# Patient Record
Sex: Male | Born: 1950 | Race: White | Hispanic: No | Marital: Married | Smoking: Former smoker
Health system: Southern US, Community
[De-identification: ages and names within clinical notes are randomized; demographics above are authoritative.]

## PROBLEM LIST (undated history)

## (undated) DIAGNOSIS — M8580 Other specified disorders of bone density and structure, unspecified site: Secondary | ICD-10-CM

## (undated) DIAGNOSIS — Z860101 Personal history of adenomatous and serrated colon polyps: Secondary | ICD-10-CM

## (undated) DIAGNOSIS — Z808 Family history of malignant neoplasm of other organs or systems: Secondary | ICD-10-CM

## (undated) DIAGNOSIS — E559 Vitamin D deficiency, unspecified: Secondary | ICD-10-CM

## (undated) DIAGNOSIS — K279 Peptic ulcer, site unspecified, unspecified as acute or chronic, without hemorrhage or perforation: Secondary | ICD-10-CM

## (undated) DIAGNOSIS — Z803 Family history of malignant neoplasm of breast: Secondary | ICD-10-CM

## (undated) DIAGNOSIS — Z807 Family history of other malignant neoplasms of lymphoid, hematopoietic and related tissues: Secondary | ICD-10-CM

## (undated) DIAGNOSIS — N200 Calculus of kidney: Secondary | ICD-10-CM

## (undated) DIAGNOSIS — Z8481 Family history of carrier of genetic disease: Principal | ICD-10-CM

## (undated) DIAGNOSIS — Z86010 Personal history of colonic polyps: Secondary | ICD-10-CM

## (undated) DIAGNOSIS — I10 Essential (primary) hypertension: Secondary | ICD-10-CM

## (undated) DIAGNOSIS — R001 Bradycardia, unspecified: Secondary | ICD-10-CM

## (undated) DIAGNOSIS — G4730 Sleep apnea, unspecified: Secondary | ICD-10-CM

## (undated) HISTORY — PX: EXTRACORPOREAL SHOCK WAVE LITHOTRIPSY: SHX1557

## (undated) HISTORY — DX: Vitamin D deficiency, unspecified: E55.9

## (undated) HISTORY — DX: Family history of malignant neoplasm of other organs or systems: Z80.8

## (undated) HISTORY — DX: Essential (primary) hypertension: I10

## (undated) HISTORY — PX: COLONOSCOPY: SHX174

## (undated) HISTORY — DX: Family history of carrier of genetic disease: Z84.81

## (undated) HISTORY — DX: Personal history of adenomatous and serrated colon polyps: Z86.0101

## (undated) HISTORY — DX: Peptic ulcer, site unspecified, unspecified as acute or chronic, without hemorrhage or perforation: K27.9

## (undated) HISTORY — DX: Other specified disorders of bone density and structure, unspecified site: M85.80

## (undated) HISTORY — DX: Personal history of colonic polyps: Z86.010

## (undated) HISTORY — DX: Family history of other malignant neoplasms of lymphoid, hematopoietic and related tissues: Z80.7

## (undated) HISTORY — DX: Family history of malignant neoplasm of breast: Z80.3

## (undated) HISTORY — DX: Calculus of kidney: N20.0

## (undated) HISTORY — DX: Sleep apnea, unspecified: G47.30

---

## 1970-05-29 HISTORY — PX: TONSILLECTOMY AND ADENOIDECTOMY: SUR1326

## 1979-05-30 HISTORY — PX: VASECTOMY: SHX75

## 1999-01-10 ENCOUNTER — Ambulatory Visit (HOSPITAL_COMMUNITY): Admission: RE | Admit: 1999-01-10 | Discharge: 1999-01-10 | Payer: Self-pay | Admitting: Gastroenterology

## 1999-01-10 ENCOUNTER — Encounter (INDEPENDENT_AMBULATORY_CARE_PROVIDER_SITE_OTHER): Payer: Self-pay

## 2001-10-23 ENCOUNTER — Ambulatory Visit (HOSPITAL_BASED_OUTPATIENT_CLINIC_OR_DEPARTMENT_OTHER): Admission: RE | Admit: 2001-10-23 | Discharge: 2001-10-23 | Payer: Self-pay | Admitting: Otolaryngology

## 2002-09-08 ENCOUNTER — Encounter: Payer: Self-pay | Admitting: Orthopedic Surgery

## 2002-09-08 ENCOUNTER — Ambulatory Visit (HOSPITAL_COMMUNITY): Admission: RE | Admit: 2002-09-08 | Discharge: 2002-09-08 | Payer: Self-pay | Admitting: Orthopedic Surgery

## 2004-03-09 ENCOUNTER — Ambulatory Visit (HOSPITAL_COMMUNITY): Admission: RE | Admit: 2004-03-09 | Discharge: 2004-03-09 | Payer: Self-pay | Admitting: Gastroenterology

## 2004-03-09 ENCOUNTER — Encounter (INDEPENDENT_AMBULATORY_CARE_PROVIDER_SITE_OTHER): Payer: Self-pay | Admitting: *Deleted

## 2008-03-01 ENCOUNTER — Emergency Department (HOSPITAL_COMMUNITY): Admission: EM | Admit: 2008-03-01 | Discharge: 2008-03-01 | Payer: Self-pay | Admitting: Family Medicine

## 2008-03-05 ENCOUNTER — Ambulatory Visit (HOSPITAL_COMMUNITY): Admission: RE | Admit: 2008-03-05 | Discharge: 2008-03-05 | Payer: Self-pay | Admitting: Urology

## 2009-08-26 IMAGING — CR DG ABDOMEN 1V
1 series · 1 of 1 positions shown · non-contrast
Comparison: CT urogram from [HOSPITAL] 03/02/2008.

CLINICAL DATA: Kidney stone.  Preoperative evaluation.

ABDOMEN - 1 VIEW

[t abdomen supine]
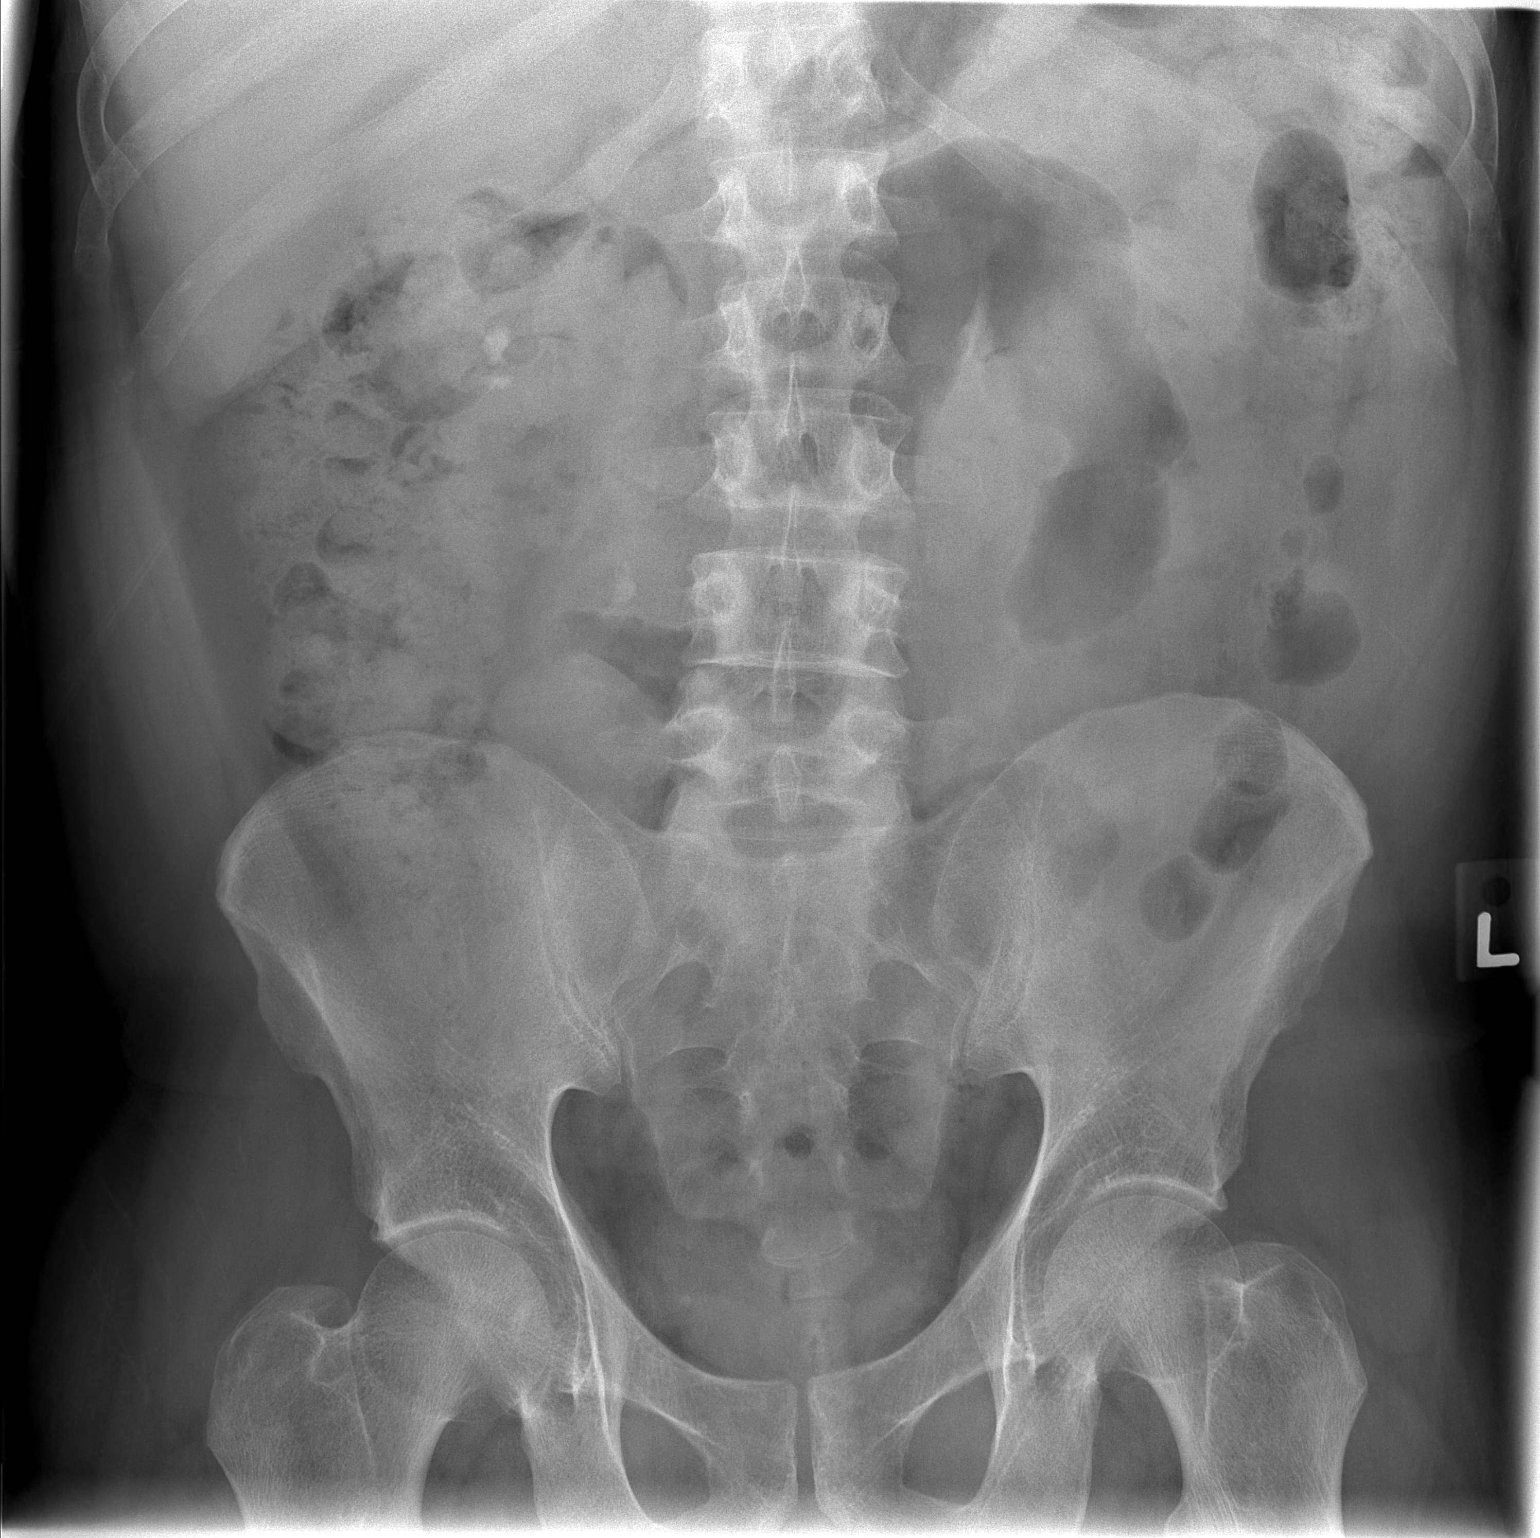

[1 of 1 positions shown; findings below may reference images not displayed]

FINDINGS: The approximately 9 mm calculus at the right ureteral
pelvic junction on the prior CT has passed distally and now
overlies the right L4 spinous process.  Right renal calculi are
redemonstrated, measuring up to 11 mm in diameter.  Small
calcifications in the right hemi pelvis are unchanged and
compatible with phleboliths.  No calcifications are seen over the
expected course of the left ureter.  The bowel gas pattern is
normal.
IMPRESSION: Slight distal passage of the right ureteral calculus, now located
at the L4 level.  Right renal calculi are unchanged.

## 2010-10-14 NOTE — Op Note (Signed)
Ryan [name], Ryan [name] NO.:  [id number]   MEDICAL RECORD NO.:  [id number]          PATIENT TYPE:  AMB   LOCATION:  ENDO                         FACILITY:  [facility]   PHYSICIAN:  Petra Kuba, M.D.    DATE OF BIRTH:  03/24/1951   DATE OF PROCEDURE:  03/09/2004  DATE OF DISCHARGE:                                 OPERATIVE REPORT   PROCEDURE PERFORMED:  Colonoscopy with polypectomy.   ENDOSCOPIST:  Petra Kuba, M.D.   INDICATIONS FOR PROCEDURE:  Patient with personal history of colon polyps,  family history of colon polyps.  Due for colonic screening.   Consent was signed after the risks, benefits, methods and options were  thoroughly discussed with the patient on multiple occasions.   MEDICINES USED:  Demerol 90 mg, Versed 9 mg.   DESCRIPTION OF PROCEDURE:  Rectal inspection was pertinent for external  hemorrhoids, small.  Digital exam was negative.  A video pediatric  adjustable colonoscope was inserted and easily advanced around the colon to  the cecum.  This did require rolling him on his back and some abdominal  pressure.  On insertion, rare left-sided diverticula were seen but no other  abnormalities.  The cecum was identified by the appendiceal orifice and the  ileocecal valve.  The scope was slowly withdrawn.  The prep was adequate.  There was some liquid stool that required washing and suctioning.  On slow  withdrawal through the colon, the cecum and the ascending were normal.  In  the mid transverse, a questionable tiny polyp was seen and was cold biopsied  times two.  There were two other tiny descending and distal sigmoid  questionable polyps which were also cold biopsied and put in the same  container.  On slow withdrawal through the colon, other than the rare left-  sided diverticula, a small pedunculated proximal sigmoid polyp was seen,  snared, electrocautery applied and the polyp was suctioned through the scope  and collected in the trap.  This  was put in a separate container.  Scope was  further withdrawn through the rectum, anorectal pullthrough and retroflexion  confirmed some small hemorrhoids.  Scope was straightened, readvanced a  short ways up the left side of the colon, air was suctioned, scope removed.  The patient tolerated the procedure well.  There was no immediate obvious  complication.   ENDOSCOPIC DIAGNOSIS:  1.  Internal and external hemorrhoids.  2.  Rare left-sided diverticula.  3.  Three questionable tiny polyps, cold biopsied in the transverse      descending and distal sigmoid.  4.  Small sigmoid polyp snared.  5.  Otherwise within normal limits to the cecum.   PLAN:  Await pathology to determine future colonic screening.  Happy to see  back p.r.n.  Otherwise return care to Dr. Kevan Ny for the customary health  care maintenance to include yearly rectals and guaiacs.       MEM/MEDQ  D:  03/09/2004  T:  03/09/2004  Job:  [id number]   cc:   Duncan Dull, M.D.  [address]  [place]  [place] [address]  Fax: [phone]

## 2011-02-28 LAB — POCT URINALYSIS DIP (DEVICE)
Ketones, ur: 15 — AB
Nitrite: NEGATIVE
Operator id: 235561
Protein, ur: 30 — AB
Urobilinogen, UA: 0.2
pH: 5

## 2012-05-29 HISTORY — PX: MOUTH SURGERY: SHX715

## 2012-07-23 ENCOUNTER — Other Ambulatory Visit: Payer: Self-pay

## 2013-07-09 DIAGNOSIS — K5730 Diverticulosis of large intestine without perforation or abscess without bleeding: Secondary | ICD-10-CM | POA: Diagnosis not present

## 2013-07-09 DIAGNOSIS — Z09 Encounter for follow-up examination after completed treatment for conditions other than malignant neoplasm: Secondary | ICD-10-CM | POA: Diagnosis not present

## 2013-07-09 DIAGNOSIS — Z86010 Personal history of colonic polyps: Secondary | ICD-10-CM | POA: Diagnosis not present

## 2013-08-06 DIAGNOSIS — Z0000 Encounter for general adult medical examination without abnormal findings: Secondary | ICD-10-CM | POA: Diagnosis not present

## 2013-08-06 DIAGNOSIS — Z23 Encounter for immunization: Secondary | ICD-10-CM | POA: Diagnosis not present

## 2013-08-06 DIAGNOSIS — E7800 Pure hypercholesterolemia, unspecified: Secondary | ICD-10-CM | POA: Diagnosis not present

## 2013-08-06 DIAGNOSIS — K219 Gastro-esophageal reflux disease without esophagitis: Secondary | ICD-10-CM | POA: Diagnosis not present

## 2013-08-06 DIAGNOSIS — I10 Essential (primary) hypertension: Secondary | ICD-10-CM | POA: Diagnosis not present

## 2013-08-06 DIAGNOSIS — Z136 Encounter for screening for cardiovascular disorders: Secondary | ICD-10-CM | POA: Diagnosis not present

## 2013-08-06 DIAGNOSIS — Z125 Encounter for screening for malignant neoplasm of prostate: Secondary | ICD-10-CM | POA: Diagnosis not present

## 2013-08-06 DIAGNOSIS — E559 Vitamin D deficiency, unspecified: Secondary | ICD-10-CM | POA: Diagnosis not present

## 2013-08-08 ENCOUNTER — Other Ambulatory Visit: Payer: Self-pay | Admitting: Family Medicine

## 2013-08-08 DIAGNOSIS — Z139 Encounter for screening, unspecified: Secondary | ICD-10-CM

## 2013-08-21 ENCOUNTER — Ambulatory Visit
Admission: RE | Admit: 2013-08-21 | Discharge: 2013-08-21 | Disposition: A | Discharge status: Home / Self Care | Payer: Medicare Other | Source: Ambulatory Visit | Attending: Family Medicine | Admitting: Family Medicine

## 2013-08-21 DIAGNOSIS — T148 Other injury of unspecified body region: Secondary | ICD-10-CM | POA: Diagnosis not present

## 2013-08-21 DIAGNOSIS — D2370 Other benign neoplasm of skin of unspecified lower limb, including hip: Secondary | ICD-10-CM | POA: Diagnosis not present

## 2013-08-21 DIAGNOSIS — D235 Other benign neoplasm of skin of trunk: Secondary | ICD-10-CM | POA: Diagnosis not present

## 2013-08-21 DIAGNOSIS — D1801 Hemangioma of skin and subcutaneous tissue: Secondary | ICD-10-CM | POA: Diagnosis not present

## 2013-08-21 DIAGNOSIS — Z139 Encounter for screening, unspecified: Secondary | ICD-10-CM

## 2013-08-21 DIAGNOSIS — L819 Disorder of pigmentation, unspecified: Secondary | ICD-10-CM | POA: Diagnosis not present

## 2013-08-21 DIAGNOSIS — D239 Other benign neoplasm of skin, unspecified: Secondary | ICD-10-CM | POA: Diagnosis not present

## 2013-08-21 DIAGNOSIS — L821 Other seborrheic keratosis: Secondary | ICD-10-CM | POA: Diagnosis not present

## 2013-08-21 DIAGNOSIS — Z136 Encounter for screening for cardiovascular disorders: Secondary | ICD-10-CM | POA: Diagnosis not present

## 2013-09-03 DIAGNOSIS — G4730 Sleep apnea, unspecified: Secondary | ICD-10-CM | POA: Diagnosis not present

## 2013-12-08 DIAGNOSIS — G4733 Obstructive sleep apnea (adult) (pediatric): Secondary | ICD-10-CM | POA: Diagnosis not present

## 2013-12-17 DIAGNOSIS — G245 Blepharospasm: Secondary | ICD-10-CM | POA: Diagnosis not present

## 2014-05-19 DIAGNOSIS — Z23 Encounter for immunization: Secondary | ICD-10-CM | POA: Diagnosis not present

## 2014-08-20 DIAGNOSIS — I10 Essential (primary) hypertension: Secondary | ICD-10-CM | POA: Diagnosis not present

## 2014-08-20 DIAGNOSIS — Z125 Encounter for screening for malignant neoplasm of prostate: Secondary | ICD-10-CM | POA: Diagnosis not present

## 2014-08-20 DIAGNOSIS — K219 Gastro-esophageal reflux disease without esophagitis: Secondary | ICD-10-CM | POA: Diagnosis not present

## 2014-08-20 DIAGNOSIS — E780 Pure hypercholesterolemia: Secondary | ICD-10-CM | POA: Diagnosis not present

## 2014-08-20 DIAGNOSIS — Z0000 Encounter for general adult medical examination without abnormal findings: Secondary | ICD-10-CM | POA: Diagnosis not present

## 2014-08-20 DIAGNOSIS — Z23 Encounter for immunization: Secondary | ICD-10-CM | POA: Diagnosis not present

## 2014-08-20 DIAGNOSIS — E559 Vitamin D deficiency, unspecified: Secondary | ICD-10-CM | POA: Diagnosis not present

## 2014-08-20 DIAGNOSIS — G4733 Obstructive sleep apnea (adult) (pediatric): Secondary | ICD-10-CM | POA: Diagnosis not present

## 2014-10-15 DIAGNOSIS — D1801 Hemangioma of skin and subcutaneous tissue: Secondary | ICD-10-CM | POA: Diagnosis not present

## 2014-10-15 DIAGNOSIS — D2371 Other benign neoplasm of skin of right lower limb, including hip: Secondary | ICD-10-CM | POA: Diagnosis not present

## 2014-10-15 DIAGNOSIS — L821 Other seborrheic keratosis: Secondary | ICD-10-CM | POA: Diagnosis not present

## 2014-12-22 DIAGNOSIS — B078 Other viral warts: Secondary | ICD-10-CM | POA: Diagnosis not present

## 2015-02-11 IMAGING — US US AORTA SCREENING (MEDICARE)
1 series · 9 of 9 positions shown · non-contrast
Comparison: None.

CLINICAL DATA: Hypertension, aneurysm screening

EXAM:
ULTRASOUND OF ABDOMINAL AORTA
TECHNIQUE: Ultrasound examination of the abdominal aorta was performed to
evaluate for abdominal aortic aneurysm.

[Series 1: us aorta screening (medicare) · 0.30mm/px · 9 of 9 slices shown]
[im 1/9]
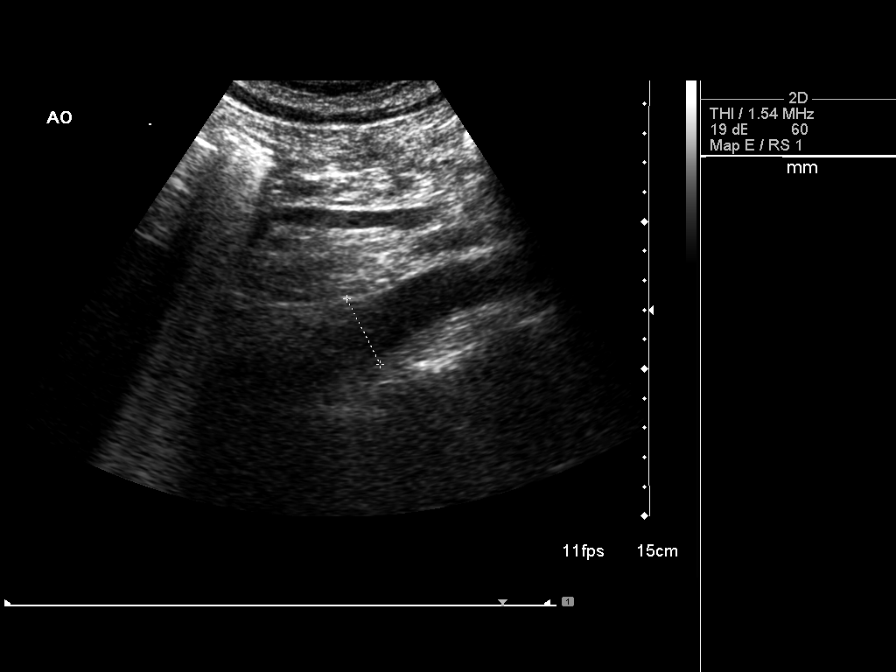
[im 2/9]
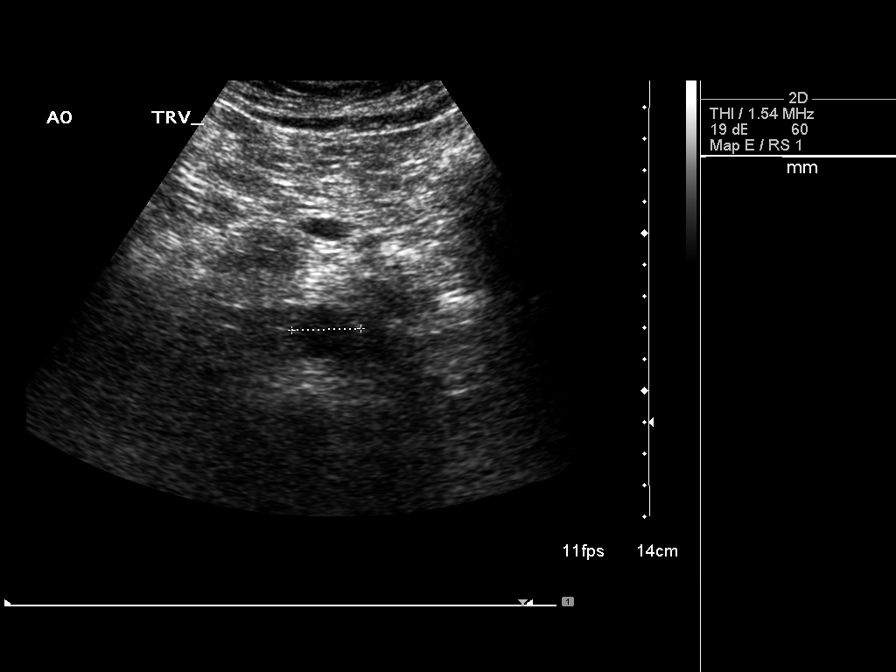
[im 3/9]
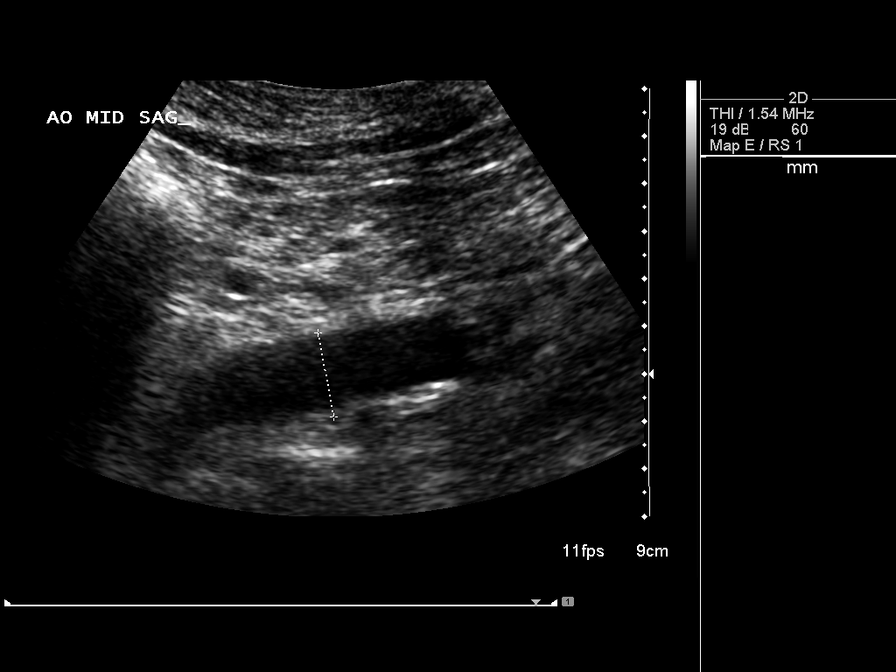
[im 4/9]
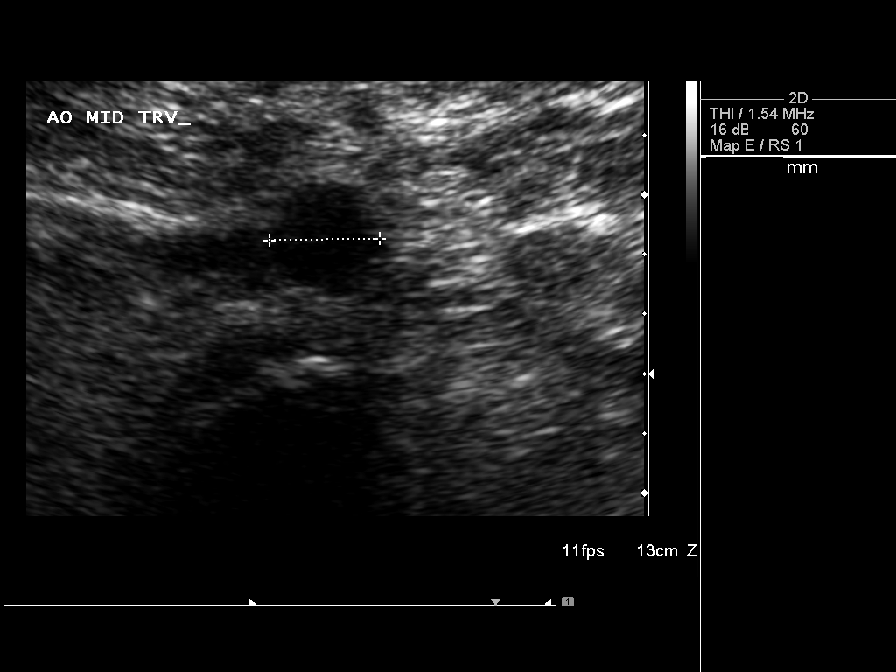
[im 5/9]
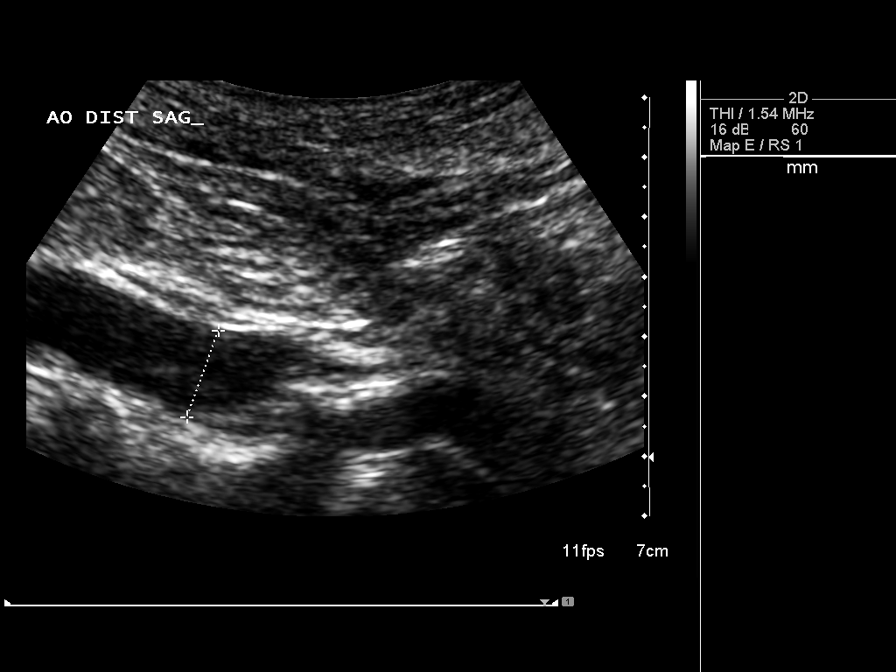
[im 6/9]
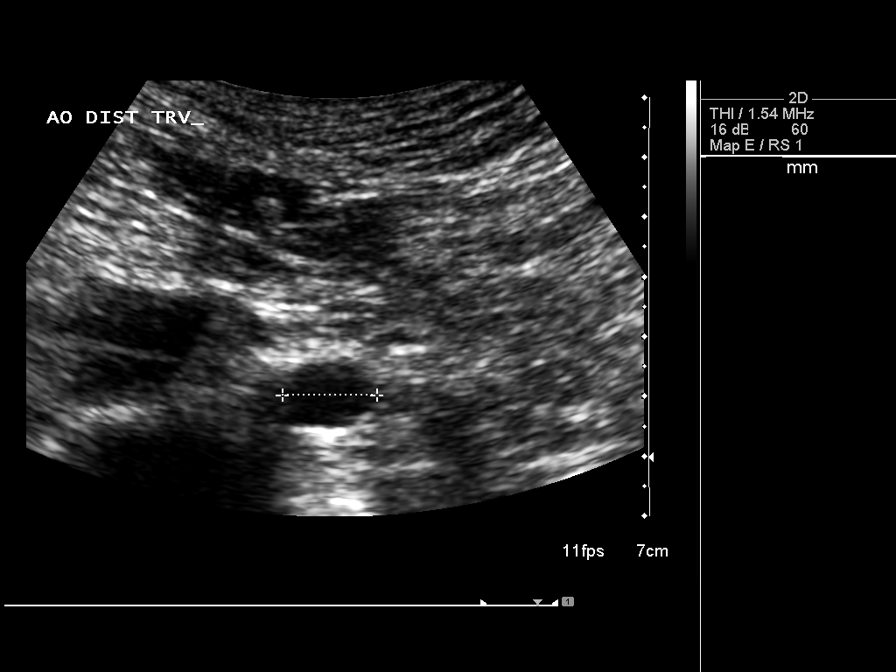
[im 7/9]
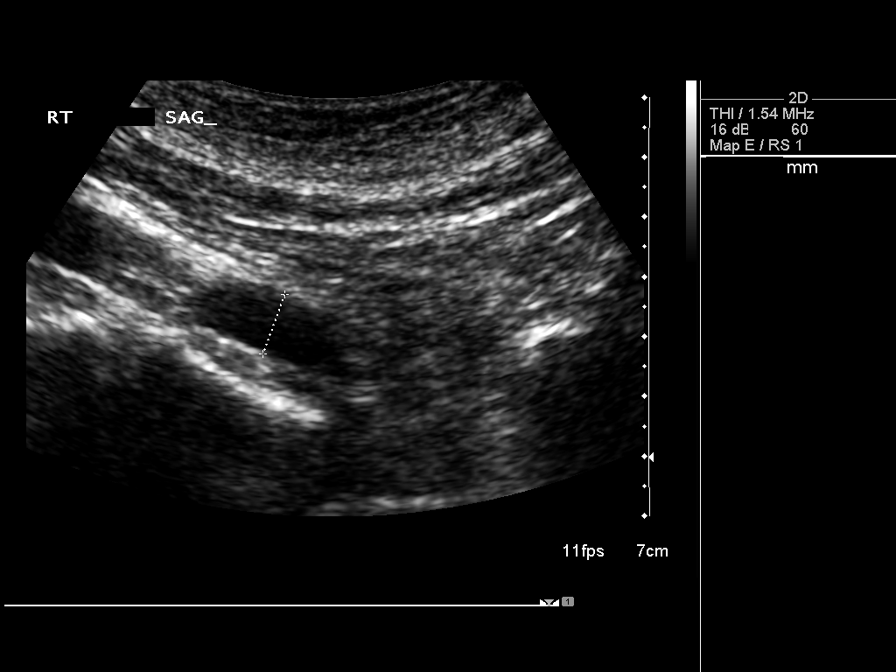
[im 8/9]
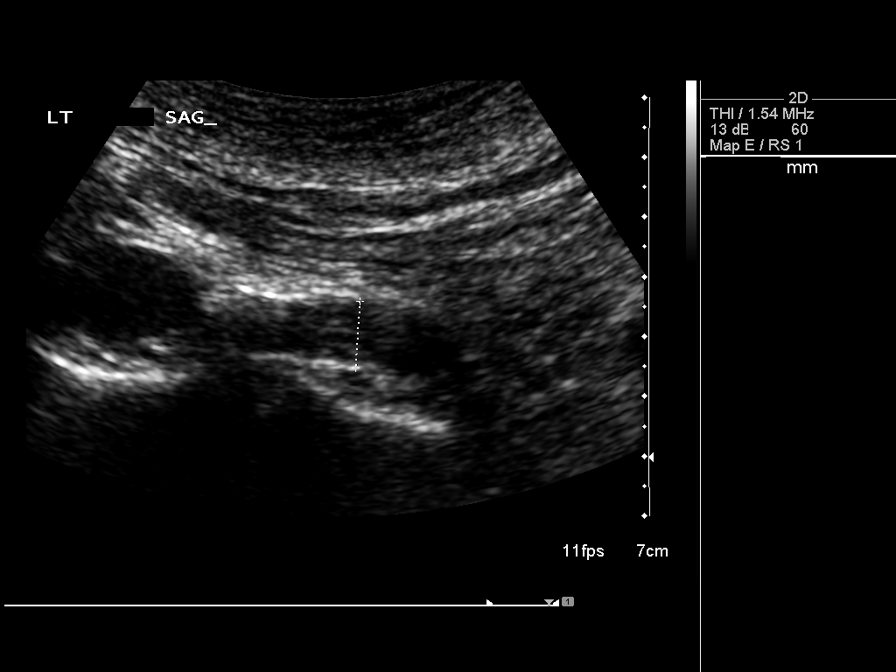
[im 9/9]
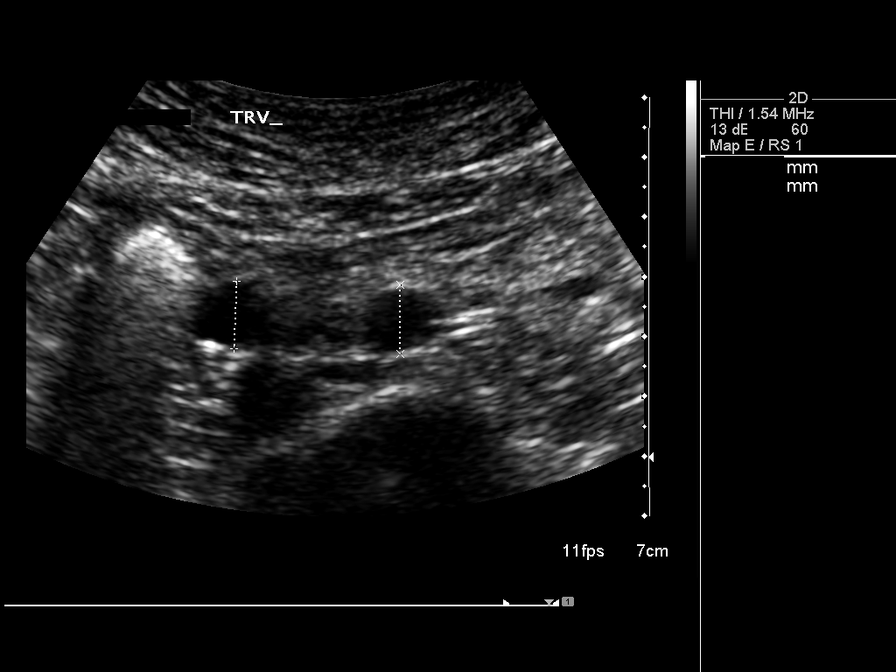

[9 of 9 positions shown; findings below may reference images not displayed]

FINDINGS: Abdominal Aorta

No aneurysm identified.

Maximum AP

Diameter:

Maximum TRV

Diameter:
IMPRESSION: Negative for abdominal aortic aneurysm

## 2015-03-22 DIAGNOSIS — H9190 Unspecified hearing loss, unspecified ear: Secondary | ICD-10-CM | POA: Diagnosis not present

## 2015-03-22 DIAGNOSIS — Z23 Encounter for immunization: Secondary | ICD-10-CM | POA: Diagnosis not present

## 2015-05-30 DIAGNOSIS — Z9289 Personal history of other medical treatment: Secondary | ICD-10-CM

## 2015-05-30 HISTORY — DX: Personal history of other medical treatment: Z92.89

## 2015-06-09 DIAGNOSIS — I10 Essential (primary) hypertension: Secondary | ICD-10-CM | POA: Diagnosis not present

## 2015-06-09 DIAGNOSIS — G4733 Obstructive sleep apnea (adult) (pediatric): Secondary | ICD-10-CM | POA: Diagnosis not present

## 2015-06-30 ENCOUNTER — Encounter: Payer: Self-pay | Admitting: Cardiology

## 2015-07-01 ENCOUNTER — Ambulatory Visit: Payer: No Typology Code available for payment source | Admitting: Cardiology

## 2015-07-09 ENCOUNTER — Encounter: Payer: Self-pay | Admitting: Cardiology

## 2015-07-09 ENCOUNTER — Ambulatory Visit (INDEPENDENT_AMBULATORY_CARE_PROVIDER_SITE_OTHER): Payer: Medicare Other | Admitting: Cardiology

## 2015-07-09 VITALS — BP 140/80 | HR 74 | Ht 68.0 in | Wt 185.2 lb

## 2015-07-09 DIAGNOSIS — Z8249 Family history of ischemic heart disease and other diseases of the circulatory system: Secondary | ICD-10-CM | POA: Diagnosis not present

## 2015-07-09 NOTE — Progress Notes (Signed)
Cardiology Office Note    Date:  07/09/2015   ID:  Ryan [name], DOB 03-Jun-1950, MRN [id number]  PCP:  Marjorie Smolder, MD  Cardiologist:   Candee Furbish, MD     History of Present Illness:  Ryan [name] is a 65 y.o. male here for evaluation of potential heart testing. Brother 73 years older than him had MI. 100% and 30% in artery. He had no warning.   He is having no symptoms.   His brother has NHL, stem cell tx.   He [place] 3 times a week. Walks a mile a day, dog.     Past Medical History  Diagnosis Date  . Hypertension   . Vitamin D deficiency   . Osteopenia   . Peptic ulcer disease   . Kidney stones   . Hx of adenomatous colonic polyps     and hyperplastic  . Sleep apnea     CPAP  . Family hx of melanoma     Past Surgical History  Procedure Laterality Date  . Tonsillectomy and adenoidectomy  1972  . Vasectomy  1981  . Mouth surgery  2014  . Colonoscopy  [id number]    Outpatient Prescriptions Prior to Visit  Medication Sig Dispense Refill  . amLODipine (NORVASC) 5 MG tablet Take 5 mg by mouth daily.    . Ascorbic Acid (VITAMIN C) 100 MG tablet Take 100 mg by mouth daily.    Marland Kitchen aspirin 81 MG tablet Take 81 mg by mouth daily.    . B Complex-Biotin-FA (TH VITAMIN B 50/B-COMPLEX PO) Take 1 tablet by mouth daily.    . Calcium Carbonate (CALCIUM 600 PO) Take 600 mg by mouth daily.    . ergocalciferol (VITAMIN D2) 50000 units capsule Take 50,000 Units by mouth once a week.    . fexofenadine (ALLEGRA) 180 MG tablet Take 180 mg by mouth daily.    . fluticasone (FLONASE) 50 MCG/ACT nasal spray Place 1 spray into both nostrils daily as needed for allergies or rhinitis.    . Misc Natural Products (SAW PALMETTO) CAPS Take 1 capsule by mouth daily.    . Multiple Vitamin (MULTIVITAMIN) tablet Take 1 tablet by mouth daily.    . Nutritional Supplements (PYCNOGENOL PO) Take 1 tablet by mouth daily.    . Omega-3 Fatty Acids (OMEGA 3 PO) Take 1 tablet by mouth daily.      Marland Kitchen omeprazole (PRILOSEC) 20 MG capsule Take 20 mg by mouth daily.    . SODIUM FLUORIDE, DENTAL RINSE, (ACT ANTICAVITY FLUORIDE RINSE) 0.05 % SOLN Use as directed 1 application in the mouth or throat 2 (two) times daily.     No facility-administered medications prior to visit.     Allergies:   Review of patient's allergies indicates no known allergies.   Social History   Social History  . Marital Status: Married    Spouse Name: N/A  . Number of Children: 2  . Years of Education: N/A   Occupational History  . RETIRED    Social History Main Topics  . Smoking status: Former Smoker    Quit date: 06/29/1970  . Smokeless tobacco: None  . Alcohol Use: 0.0 oz/week    0 Standard drinks or equivalent per week  . Drug Use: None  . Sexual Activity: Not Asked   Other Topics Concern  . None   Social History Narrative     Family History:  The patient's family history includes CAD in his brother.   ROS:  Please see the history of present illness.    ROS no chest pain, no shortness of breath, no syncopal episodes All other systems reviewed and are negative.   PHYSICAL EXAM:   VS:  BP 140/80 mmHg  Pulse 74  Ht 5\' 8"  (1.727 m)  Wt 185 lb 3.2 oz (84.006 kg)  BMI 28.17 kg/m2   GEN: Well nourished, well developed, in no acute distress HEENT: normal Neck: no JVD, carotid bruits, or masses Cardiac: RRR; no murmurs, rubs, or gallops,no edema  Respiratory:  clear to auscultation bilaterally, normal work of breathing GI: soft, nontender, nondistended, + BS MS: no deformity or atrophy Skin: warm and dry, no rash Neuro:  Alert and Oriented x 3, Strength and sensation are intact Psych: euthymic mood, full affect  Wt Readings from Last 3 Encounters:  07/09/15 185 lb 3.2 oz (84.006 kg)      Studies/Labs Reviewed:   EKG:  EKG is ordered today.  The ekg ordered today demonstrates 07/09/15-sinus rhythm, 74, no adverse changes, personally viewed  Recent Labs: No results found for  requested labs within last 365 days.   Lipid Panel No results found for: CHOL, TRIG, HDL, CHOLHDL, VLDL, LDLCALC, LDLDIRECT  Additional studies/ records that were reviewed today include:  LDL 108    ASSESSMENT:    1. Family history of coronary artery disease      PLAN:  In order of problems listed above:  1. Strong family history of CAD- treadmill test. His brother had heart attack/coronary artery disease diagnosis without any significant symptoms preceding. We've reviewed his that the profile, LDL 108. Excellent. Continue with diet, exercise. His wife takes ready's twice. This would not be unreasonable for him to take the supplement as well. We also gave him the option of calcium score but he would rather the stress test. This is fine.    Medication Adjustments/Labs and Tests Ordered: Current medicines are reviewed at length with the patient today.  Concerns regarding medicines are outlined above.  Medication changes, Labs and Tests ordered today are listed in the Patient Instructions below. Patient Instructions  Medication Instructions:  The current medical regimen is effective;  continue present plan and medications.  Testing/Procedures: Your physician has requested that you have an exercise tolerance test. For further information please visit [email or web address]. Please also follow instruction sheet, as given.  Follow-Up: Follow up as needed with Dr Marlou Porch after your testing.  If you need a refill on your cardiac medications before your next appointment, please call your pharmacy.  Thank you for choosing [facility]!!             Signed, Candee Furbish, MD  07/09/2015 5:12 PM    [facility] [place], [place], [place]  [address] Phone: [phone]; Fax: [phone]

## 2015-07-09 NOTE — Patient Instructions (Signed)
Medication Instructions:  The current medical regimen is effective;  continue present plan and medications.  Testing/Procedures: Your physician has requested that you have an exercise tolerance test. For further information please visit [email or web address]. Please also follow instruction sheet, as given.  Follow-Up: Follow up as needed with Dr Marlou Porch after your testing.  If you need a refill on your cardiac medications before your next appointment, please call your pharmacy.  Thank you for choosing [facility]!!

## 2015-07-12 ENCOUNTER — Encounter: Payer: Self-pay | Admitting: Cardiology

## 2015-07-15 ENCOUNTER — Ambulatory Visit (INDEPENDENT_AMBULATORY_CARE_PROVIDER_SITE_OTHER): Payer: Medicare Other

## 2015-07-15 DIAGNOSIS — Z8249 Family history of ischemic heart disease and other diseases of the circulatory system: Secondary | ICD-10-CM

## 2015-07-15 LAB — EXERCISE TOLERANCE TEST
CHL CUP RESTING HR STRESS: 73 {beats}/min
CHL CUP STRESS STAGE 1 HR: 70 {beats}/min
CHL CUP STRESS STAGE 2 SPEED: 1 mph
CHL CUP STRESS STAGE 4 GRADE: 10 %
CHL CUP STRESS STAGE 5 DBP: 98 mmHg
CHL CUP STRESS STAGE 5 SBP: 217 mmHg
CHL CUP STRESS STAGE 6 DBP: 107 mmHg
CHL CUP STRESS STAGE 6 SPEED: 3.4 mph
CHL CUP STRESS STAGE 8 HR: 94 {beats}/min
CHL CUP STRESS STAGE 8 SPEED: 0 mph
CHL RATE OF PERCEIVED EXERTION: 16
CSEPEDS: 0 s
CSEPEW: 10.1 METS
CSEPPBP: 228 mmHg
Exercise duration (min): 9 min
MPHR: 153 {beats}/min
Peak HR: 137 {beats}/min
Percent HR: 89 %
Percent of predicted max HR: 89 %
Stage 1 DBP: 100 mmHg
Stage 1 Grade: 0 %
Stage 1 SBP: 168 mmHg
Stage 1 Speed: 0 mph
Stage 2 Grade: 0 %
Stage 2 HR: 70 {beats}/min
Stage 3 Grade: 0 %
Stage 3 HR: 71 {beats}/min
Stage 3 Speed: 1 mph
Stage 4 DBP: 95 mmHg
Stage 4 HR: 96 {beats}/min
Stage 4 SBP: 215 mmHg
Stage 4 Speed: 1.7 mph
Stage 5 Grade: 12 %
Stage 5 HR: 117 {beats}/min
Stage 5 Speed: 2.5 mph
Stage 6 Grade: 14 %
Stage 6 HR: 137 {beats}/min
Stage 6 SBP: 228 mmHg
Stage 7 DBP: 107 mmHg
Stage 7 Grade: 0 %
Stage 7 HR: 118 {beats}/min
Stage 7 SBP: 232 mmHg
Stage 7 Speed: 1.5 mph
Stage 8 DBP: 99 mmHg
Stage 8 Grade: 0 %
Stage 8 SBP: 199 mmHg

## 2015-07-15 NOTE — Addendum Note (Signed)
Addended by: Freada Bergeron on: 07/15/2015 05:28 PM   Modules accepted: Orders

## 2015-07-16 ENCOUNTER — Telehealth: Payer: Self-pay | Admitting: Cardiology

## 2015-07-16 NOTE — Telephone Encounter (Signed)
Reviewed results of GXT with pt who will continue to monitor his BP and have Dr Inda Merlin manage it.

## 2015-07-16 NOTE — Telephone Encounter (Signed)
Returning your call from yesterday. °

## 2015-08-30 DIAGNOSIS — E7800 Pure hypercholesterolemia, unspecified: Secondary | ICD-10-CM | POA: Diagnosis not present

## 2015-08-30 DIAGNOSIS — G4733 Obstructive sleep apnea (adult) (pediatric): Secondary | ICD-10-CM | POA: Diagnosis not present

## 2015-08-30 DIAGNOSIS — M8580 Other specified disorders of bone density and structure, unspecified site: Secondary | ICD-10-CM | POA: Diagnosis not present

## 2015-08-30 DIAGNOSIS — E559 Vitamin D deficiency, unspecified: Secondary | ICD-10-CM | POA: Diagnosis not present

## 2015-08-30 DIAGNOSIS — I10 Essential (primary) hypertension: Secondary | ICD-10-CM | POA: Diagnosis not present

## 2015-08-30 DIAGNOSIS — Z125 Encounter for screening for malignant neoplasm of prostate: Secondary | ICD-10-CM | POA: Diagnosis not present

## 2015-08-30 DIAGNOSIS — Z0000 Encounter for general adult medical examination without abnormal findings: Secondary | ICD-10-CM | POA: Diagnosis not present

## 2015-09-28 DIAGNOSIS — M8589 Other specified disorders of bone density and structure, multiple sites: Secondary | ICD-10-CM | POA: Diagnosis not present

## 2015-09-28 DIAGNOSIS — M859 Disorder of bone density and structure, unspecified: Secondary | ICD-10-CM | POA: Diagnosis not present

## 2015-10-20 DIAGNOSIS — D225 Melanocytic nevi of trunk: Secondary | ICD-10-CM | POA: Diagnosis not present

## 2015-10-20 DIAGNOSIS — L918 Other hypertrophic disorders of the skin: Secondary | ICD-10-CM | POA: Diagnosis not present

## 2015-10-20 DIAGNOSIS — D2261 Melanocytic nevi of right upper limb, including shoulder: Secondary | ICD-10-CM | POA: Diagnosis not present

## 2015-10-20 DIAGNOSIS — L821 Other seborrheic keratosis: Secondary | ICD-10-CM | POA: Diagnosis not present

## 2015-10-20 DIAGNOSIS — B078 Other viral warts: Secondary | ICD-10-CM | POA: Diagnosis not present

## 2015-10-20 DIAGNOSIS — L814 Other melanin hyperpigmentation: Secondary | ICD-10-CM | POA: Diagnosis not present

## 2015-10-20 DIAGNOSIS — D1801 Hemangioma of skin and subcutaneous tissue: Secondary | ICD-10-CM | POA: Diagnosis not present

## 2015-12-20 DIAGNOSIS — H2513 Age-related nuclear cataract, bilateral: Secondary | ICD-10-CM | POA: Diagnosis not present

## 2015-12-20 DIAGNOSIS — H52203 Unspecified astigmatism, bilateral: Secondary | ICD-10-CM | POA: Diagnosis not present

## 2016-03-17 DIAGNOSIS — Z23 Encounter for immunization: Secondary | ICD-10-CM | POA: Diagnosis not present

## 2016-06-05 DIAGNOSIS — R2232 Localized swelling, mass and lump, left upper limb: Secondary | ICD-10-CM | POA: Diagnosis not present

## 2016-06-05 DIAGNOSIS — I10 Essential (primary) hypertension: Secondary | ICD-10-CM | POA: Diagnosis not present

## 2016-09-11 DIAGNOSIS — I10 Essential (primary) hypertension: Secondary | ICD-10-CM | POA: Diagnosis not present

## 2016-09-11 DIAGNOSIS — E7800 Pure hypercholesterolemia, unspecified: Secondary | ICD-10-CM | POA: Diagnosis not present

## 2016-09-11 DIAGNOSIS — Z125 Encounter for screening for malignant neoplasm of prostate: Secondary | ICD-10-CM | POA: Diagnosis not present

## 2016-09-11 DIAGNOSIS — E559 Vitamin D deficiency, unspecified: Secondary | ICD-10-CM | POA: Diagnosis not present

## 2016-09-11 DIAGNOSIS — Z0000 Encounter for general adult medical examination without abnormal findings: Secondary | ICD-10-CM | POA: Diagnosis not present

## 2016-10-19 DIAGNOSIS — B078 Other viral warts: Secondary | ICD-10-CM | POA: Diagnosis not present

## 2016-10-19 DIAGNOSIS — D2262 Melanocytic nevi of left upper limb, including shoulder: Secondary | ICD-10-CM | POA: Diagnosis not present

## 2016-10-19 DIAGNOSIS — D1801 Hemangioma of skin and subcutaneous tissue: Secondary | ICD-10-CM | POA: Diagnosis not present

## 2016-10-19 DIAGNOSIS — L821 Other seborrheic keratosis: Secondary | ICD-10-CM | POA: Diagnosis not present

## 2016-10-19 DIAGNOSIS — D692 Other nonthrombocytopenic purpura: Secondary | ICD-10-CM | POA: Diagnosis not present

## 2016-10-19 DIAGNOSIS — L814 Other melanin hyperpigmentation: Secondary | ICD-10-CM | POA: Diagnosis not present

## 2016-12-18 DIAGNOSIS — E7800 Pure hypercholesterolemia, unspecified: Secondary | ICD-10-CM | POA: Diagnosis not present

## 2017-02-19 DIAGNOSIS — Z23 Encounter for immunization: Secondary | ICD-10-CM | POA: Diagnosis not present

## 2017-02-19 DIAGNOSIS — M6208 Separation of muscle (nontraumatic), other site: Secondary | ICD-10-CM | POA: Diagnosis not present

## 2017-02-21 DIAGNOSIS — H524 Presbyopia: Secondary | ICD-10-CM | POA: Diagnosis not present

## 2017-02-21 DIAGNOSIS — H2513 Age-related nuclear cataract, bilateral: Secondary | ICD-10-CM | POA: Diagnosis not present

## 2017-02-21 DIAGNOSIS — D3101 Benign neoplasm of right conjunctiva: Secondary | ICD-10-CM | POA: Diagnosis not present

## 2017-03-29 ENCOUNTER — Other Ambulatory Visit: Payer: Medicare Other

## 2017-03-29 ENCOUNTER — Encounter: Payer: Self-pay | Admitting: Genetics

## 2017-03-29 ENCOUNTER — Ambulatory Visit (HOSPITAL_BASED_OUTPATIENT_CLINIC_OR_DEPARTMENT_OTHER): Payer: Medicare Other | Admitting: Genetics

## 2017-03-29 DIAGNOSIS — Z808 Family history of malignant neoplasm of other organs or systems: Secondary | ICD-10-CM | POA: Diagnosis not present

## 2017-03-29 DIAGNOSIS — Z807 Family history of other malignant neoplasms of lymphoid, hematopoietic and related tissues: Secondary | ICD-10-CM

## 2017-03-29 DIAGNOSIS — Z803 Family history of malignant neoplasm of breast: Secondary | ICD-10-CM | POA: Diagnosis not present

## 2017-03-29 DIAGNOSIS — Z8481 Family history of carrier of genetic disease: Secondary | ICD-10-CM

## 2017-03-29 DIAGNOSIS — Z7183 Encounter for nonprocreative genetic counseling: Secondary | ICD-10-CM

## 2017-03-29 NOTE — Progress Notes (Signed)
REFERRING PROVIDER: Self- Daughter [phone])  PRIMARY PROVIDER:  Darcus Austin, MD  REFERRING PROVIDER: Darcus Austin, MD [address], [place] [address]  PRIMARY PROVIDER:  Darcus Austin, MD  PRIMARY REASON FOR VISIT:  1. Family history of genetic disease carrier   2. Family history of breast cancer   3. Family history of melanoma   4. Family history of non-Hodgkin's lymphoma    HISTORY OF PRESENT ILLNESS:   Ryan [name], a 66 y.o. male, was seen for a [place] cancer genetics consultation due to a family history of a CHEK2 pathogenic variant identified in his daughter.  Ryan [name] presents to clinic today to discuss the possibility of a hereditary predisposition to cancer, genetic testing, and to further clarify his future cancer risks, as well as potential cancer risks for family members.    Ryan [name] is a 66 y.o. male with no personal history of cancer.    HORMONAL RISK FACTORS:  Colonoscopy: yes; some polyps identified- reports that he gets a colonoscopy every 5 years due to his polyp history.  Past Medical History:  Diagnosis Date  . Family hx of melanoma   . Hx of adenomatous colonic polyps    and hyperplastic  . Hypertension   . Kidney stones   . Osteopenia   . Peptic ulcer disease   . Sleep apnea    CPAP  . Vitamin D deficiency     Past Surgical History:  Procedure Laterality Date  . COLONOSCOPY  [id number]  . MOUTH SURGERY  2014  . TONSILLECTOMY AND ADENOIDECTOMY  1972  . VASECTOMY  31    Social History   Social History  . Marital status: Married    Spouse name: N/A  . Number of children: 2  . Years of education: N/A   Occupational History  . RETIRED    Social History Main Topics  . Smoking status: Former Smoker    Quit date: 06/29/1970  . Smokeless tobacco: Not on file  . Alcohol use 0.0 oz/week  . Drug use: Unknown  . Sexual activity: Not on file   Other Topics Concern  . Not on file   Social History Narrative   . No narrative on file     FAMILY HISTORY:  We obtained a detailed, 4-generation family history.  Significant diagnoses are listed below: Family History  Problem Relation Age of Onset  . CAD Brother    Ryan [name] has a 64 year-old daughter who was recently diagnosed with breast cancer and had genetic testing in September 2018.  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  Her genetic testing also revealed a Variant of Uncertain Significance in [name] c.803_811del. Ryan [name] also has a 2 year-old son with no history of cancer.    Ryan [name] has 2 brothers.  One brother is 24 and has a history of melanoma at age 58 and Non-Hodgkins Lymphoma at age 39.  This brother has a son and a daughter with no history of cancer.  Ryan [name] other brother is 80 with no history of cancer, he has a daughter with no history of cancer.    Ryan [name] father died at 36 with no history of cancer.  He has 1 paternal uncle who died in his 50's of a heart attack and 2 paternal aunts who have no history of cancer.  Ryan [name] has several paternal cousins- one died in their 25's of cancer, but  the type and more details are unknown.  Ryan [name] does not know much information about his paternal grandparents and their health.    Ryan [name] mother died at 84 with no history of cancer.  Ryan [name] had a maternal aunt and a maternal uncle with no history of cancer.  Ryan [name] has several maternal cousins, no history of cancer that he is aware of in these relatives.  Ryan [name] maternal grandfather died due to an accident and his maternal grandmother died of Parkinson's disease.    Ryan [name] are of [place] descent. There is no reported Ashkenazi Jewish ancestry. There is no known consanguinity.  GENETIC COUNSELING ASSESSMENT: Ryan [name] is a 66 y.o. male with a family history of a CHEK2 mutation identified in his daughter. We,  therefore, discussed and recommended the following at today's visit.   Ryan [name]. Holdren daughter has a CHEK2 pathogenic mutation.  At this time it is unknown if this CHEK2 mutation came from Ryan [name]. Hoe, or his daughter's mother.  Genetic testing was discussed to help determine which side of the family this CHEK2 mutation was inherited from and weather Ryan [name]. Radilla has the familial pathogenic variant and is at risk for cancers associated with CHEK2.   DISCUSSION:   CHEK2 mutations have been found to be associated with an increased risk of breast, colon, and other cancers (such as prostate, thyroid, etc). The estimated cancer risks vary widely and may be influenced by family history. Women with a CHEK2 deleterious mutation have approximately a 24% (no family history of breast cancer) to 48% (strong family history of breast cancer) lifetime risk of breast cancer and up to a 25% risk of a second breast cancer. Men may have an increased risk for male breast cancer of about 1%. Men and women may have an increased risk of colon cancer (~10% lifetime risk). There are no established management guidelines for individuals with disease causing mutations in CHEK2. However, management options based on other genes associated with high risks of breast cancer (such as the BRCA1 and BRCA2) may be appropriate to follow.   CANCER SCREENING: Below are the NCCN Practice guidelines for women and men with an inherited mutation in BRCA1 or BRCA2.  You do not carry a mutation in BRCA1 or BRCA2.  However, because the breast cancer risks for women and prostate cancer risks for men may be similar, it is appropriate to consider these high risk management recommendations.  Breast Management Options We reviewed the NCCN practice guidelines (v.2.2014) for breast management for women at an increased risk of breast cancer because of BRCA1 or BRCA2 mutations:   1. Breast awareness (which may include periodic, consistent breast self exam)  starting at age 96.  2. Clinical breast exam, every 6-12 months, starting at age 79.  28. Breast screening:  . Age 54-29, annual breast MRI screening (preferred), or individualized based on earliest age of breast cancer onset in family.  . Age 48-75, annual mammogram and breast MRI screening.  . Age >75, management should be considered on an individual basis. 4. Discuss risk reducing mastectomy.   Counseling may include a discussion regarding degree of protection, reconstruction options, and risks.  5. Address psychosocial, social, and quality of life aspects of undergoing and risk reducing mastectomy.  6. Consider chemoprevention options for breast and ovarian cancer, including discussing risks and benefits.  7. Consider investigational imaging and screening studies, when available in the context of clinical trial.   Male Breast and Prostate Management Options  We reviewed the NCCN practice guidelines (v.2.2014) for male breast and prostate management for men at high risk of male breast and prostate cancer because of BRCA1 or BRCA2 mutations:  1. Breast self-exam training and education starting at age 30.  2. Clinical breast exam, every 6-12 months, starting at age 66 years  46. Consider baseline screening mammogram at age 25 with annual mammograms if gynecomastia or parenchymal/glandular breast density on baseline study.  4. Consider prostate cancer screening starting at age 59 years.   Colon Cancer Management:  Men and women with a deleterious CHEK2 mutation may have up to a 10% lifetime risk for colon cancer.  It is recommended that individuals with a CHEK2 mutation and no first degree relatives with colorectal cancer have colonoscopy screening every 5 years beginning at age 71.  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  There  is a 50% chance that Ryan [name] also has this variant of uncertain significance as well.  It is unknown if this variant is associated with increased risk for cancer, or if it is a normal finding in you. With time, the significance will likely be determined and we will review any new information regarding this variant at that time if Ryan [name]. Bribiesca is identified to have this VUS.    We recommended Ryan [name]. Lovelady pursue genetic testing for the pathogenic CHEK2 variant identified in his daughter.  We also offered him the option to have genetic testing for a panel of cancer risk genes.  Ryan [name]. Grunewald elected to have genetic testing for the Common Hereditary Cancer Panel.  The Hereditary Gene Panel offered by [facility] includes sequencing and/or deletion duplication testing of the following 47 genes: APC, ATM, AXIN2, BARD1, BMPR1A, BRCA1, BRCA2, BRIP1, CDH1, CDKN2A (p14ARF), CDKN2A (p16INK4a), CKD4, CHEK2, CTNNA1, DICER1, EPCAM (Deletion/duplication testing only), GREM1 (promoter region deletion/duplication testing only), KIT, MEN1, MLH1, MSH2, MSH3, MSH6, MUTYH, NBN, NF1, NHTL1, PALB2, PDGFRA, PMS2, POLD1, POLE, PTEN, RAD50, RAD51C, RAD51D, SDHB, SDHC, SDHD, SMAD4, SMARCA4. STK11, TP53, TSC1, TSC2, and VHL.  The following genes were evaluated for sequence changes only: SDHA and HOXB13 c.251G>A variant only.  DISCUSSION: We reviewed the characteristics, features and inheritance patterns of hereditary cancer syndromes. We also discussed genetic testing, including the appropriate family members to test, the process of testing, insurance coverage and turn-around-time for results. We discussed the implications of a negative, positive and/or variant of uncertain significant result.   We discussed that there are several other genes that are associated with an increased risk for many different types of cancer in addition to the CHEK2 gene we had previously discussed.     We discussed that if he is found to have a mutation in one  of these genes, it may impact future medical management recommendations such as increased cancer screenings and consideration of risk reducing surgeries.  A positive result could also have implications for the Ryan family members.  A Negative result would mean that Ryan [name]. Pickrel does not have the familial CHEK2 c.11delC pathogenic variant and the cancer risks associated with this gene.  Negative panel testing would also indicate that it is unlikely Ryan [name]. [place] has an increased cancer risk associated with this gene.  While genetic testing is not perfect and a negative result does not definitively rule out a hereditary risk for cancer, based on a negative result and his family history, we would estimate that his cancer risk is  fairly close to that of the general population.  However, his brother's melanoma and non-hodgkin's lymphoma may somewhat increase his risk to develop these cancers.    We discussed the potential to find a Variant of Uncertain Significance or VUS.  These are variants that have not yet been identified as pathogenic or benign, and it is unknown if this variant is associated with increased cancer risk or if this is a normal finding.  Most VUS's are reclassified to benign or likely benign.   It should not be used to make medical management decisions. With time, we suspect the lab will determine the significance of any VUS's identified if any.   Based on Ryan [name]. Carrera personal and family history of cancer, he meets medical criteria for genetic testing. Despite that he meets criteria, he may still have an out of pocket cost. We discussed that if his out of pocket cost for testing is over $100, the laboratory will call and confirm whether he wants to proceed with testing.  If the out of pocket cost of testing is less than $100 he will be billed by the genetic testing laboratory.   PLAN: After considering the risks, benefits, and limitations, Ryan [name]. Vivier  provided informed consent to pursue  genetic testing and the blood sample was sent to [facility] for analysis of the Common Hereditary Cancer Panel. Results should be available within approximately 2-3 weeks' time, at which point they will be disclosed by telephone to Ryan [name]. Vandam, as will any additional recommendations warranted by these results. Ryan [name]. Metayer will receive a summary of his genetic counseling visit and a copy of his results once available. This information will also be available in Epic. We encouraged Ryan [name]. Mcaulay to remain in contact with cancer genetics annually so that we can continuously update the family history and inform him of any changes in cancer genetics and testing that may be of benefit for his family. Ryan [name]. Mccree questions were answered to his satisfaction today. Our contact information was provided should additional questions or concerns arise.  We did recommend that regardless of Ryan [name]. Crisco genetic test results, we would recommend his son have genetic testing for the familial CHEK2 pathogenic variant. Because his sister is CHEK2 positive, there is a 50% chance her brother (Ryan [name]. Grainger son) also carries this mutation and has increased cancer risk.   Lastly, we encouraged Ryan [name]. Braley to remain in contact with cancer genetics annually so that we can continuously update the family history and inform him of any changes in cancer genetics and testing that may be of benefit for this family.   Ryan [name].  Rufo questions were answered to his satisfaction today. Our contact information was provided should additional questions or concerns arise. Thank you for the referral and allowing [place] to share in the care of your patient.   Tana Felts, MS Genetic Counselor [email or web address] phone: [phone]  The patient was seen for a total of 40 minutes in face-to-face genetic counseling. The patient was accompanied today by his wife Vivien Rota.

## 2017-04-02 ENCOUNTER — Encounter: Payer: Self-pay | Admitting: Genetics

## 2017-04-02 DIAGNOSIS — Z8481 Family history of carrier of genetic disease: Secondary | ICD-10-CM | POA: Insufficient documentation

## 2017-04-02 DIAGNOSIS — Z808 Family history of malignant neoplasm of other organs or systems: Secondary | ICD-10-CM | POA: Insufficient documentation

## 2017-04-02 DIAGNOSIS — Z803 Family history of malignant neoplasm of breast: Secondary | ICD-10-CM | POA: Insufficient documentation

## 2017-04-02 DIAGNOSIS — Z807 Family history of other malignant neoplasms of lymphoid, hematopoietic and related tissues: Secondary | ICD-10-CM | POA: Insufficient documentation

## 2017-04-02 HISTORY — DX: Family history of carrier of genetic disease: Z84.81

## 2017-04-10 ENCOUNTER — Telehealth: Payer: Self-pay | Admitting: Genetics

## 2017-04-18 NOTE — Telephone Encounter (Signed)
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  I explained that he had the same [place] mutation identified in his daughter and that she inherited this from his side of the family.  We discussed other relatives who we recommend testing for and I provided the [facility] find a genetic counselor link to help relatives who are not local find a Dietitian.  He is aware that the laboratory offers free testing for the CHEK2 gene to all relatives within 90 days.    We explained that It is recommended he have colonoscopies every 5 years (as opposed to the general population every 10).  He reports that he is already doing colon screening this often.  I will send these results back to his primary care doctor Dr. Inda Merlin who he says manages his colon, prostate and other screening.  I did mention that he should discuss his options and make a plan for prostate cancer screening with his doctors given the preliminary evidence of prostate cancer risk in individuals with CHEK2. 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  We do not recommend changing medical management based on VUS's such as this.

## 2017-04-23 ENCOUNTER — Ambulatory Visit: Payer: Self-pay | Admitting: Genetics

## 2017-04-23 ENCOUNTER — Encounter: Payer: Self-pay | Admitting: Genetics

## 2017-04-23 DIAGNOSIS — Z1589 Genetic susceptibility to other disease: Secondary | ICD-10-CM

## 2017-04-23 DIAGNOSIS — Z1503 Genetic susceptibility to malignant neoplasm of prostate: Secondary | ICD-10-CM

## 2017-04-23 DIAGNOSIS — Z1509 Genetic susceptibility to other malignant neoplasm: Secondary | ICD-10-CM

## 2017-04-23 DIAGNOSIS — Z1501 Genetic susceptibility to malignant neoplasm of breast: Secondary | ICD-10-CM | POA: Insufficient documentation

## 2017-04-23 DIAGNOSIS — Z1379 Encounter for other screening for genetic and chromosomal anomalies: Secondary | ICD-10-CM | POA: Insufficient documentation

## 2017-04-23 NOTE — Progress Notes (Signed)
HPI: Mr. Cowans was previously seen in the [facility] clinic on 03/29/2017 due to a family history of a CHEK2 mutation identified in his daughter.  Please refer to our prior cancer genetics clinic note for more information regarding Mr. Kuechle medical, social and family histories, and our assessment and recommendations, at the time. Mr. Treese recent genetic test results were disclosed to him, as well as recommendations warranted by these results. These results and recommendations are discussed in more detail below.    FAMILY HISTORY:  We obtained a detailed, 4-generation family history.  Significant diagnoses are listed below: Family History  Problem Relation Age of Onset  . Melanoma Brother 55  . Non-Hodgkin's lymphoma Brother 59  . CAD Brother   . Breast cancer Daughter 38       CHEK2+  . Heart attack Paternal Uncle 23  . Parkinson's disease Maternal Grandmother   . Cancer Cousin 23       type unk   Mr. Calzadilla has a 45 year-old daughter who was recently diagnosed with breast cancer and had genetic testing in September 2018.  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  Her genetic testing also revealed a Variant of Uncertain Significance in [facility] c.803_811del. Mr. Accardo also has a 22 year-old son with no history of cancer.    Mr. Dimaano has 2 brothers.  One brother is 65 and has a history of melanoma at age 5 and Non-Hodgkins Lymphoma at age 50.  This brother has a son and a daughter with no history of cancer.  Mr. Riner other brother is 47 with no history of cancer, he has a daughter with no history of cancer.    Mr. Snodgrass father died at 49 with no history of cancer.  He has 1 paternal uncle who died in his 73's of a heart attack and 2 paternal aunts who have no history of cancer.  Mr. Rooke has several paternal cousins- one died in their 107's of cancer, but the type and more details are unknown.   Mr. Neto does not know much information about his paternal grandparents and their health.    Mr. Urenda mother died at 33 with no history of cancer.  Mr. Dingee had a maternal aunt and a maternal uncle with no history of cancer.  Mr. Zehren has several maternal cousins, no history of cancer that he is aware of in these relatives.  Mr. Clayburn Pert maternal grandfather died due to an accident and his maternal grandmother died of Parkinson's disease.    Patient's ancestors are of [place] descent. There is no reported Ashkenazi Jewish ancestry. There is no known consanguinity.  GENETIC TEST RESULTS: Genetic testing was performed through [facility] Common Hereditary Cancer Panel reported out on 04/09/2017.  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 c.803_811del (p.Val268_Pro270del)  The test report will be scanned into EPIC and will be located under the Molecular Pathology section of the Results Review tab.A portion of the result report is included below for reference.       We discussed with Mr. Valli that because current genetic testing is not perfect, it is possible there may be a gene mutation in one of these genes that current testing cannot detect, but that chance is small. We also discussed, that there could be another gene that has not yet been  discovered, or that we have not yet tested, that is responsible for the cancer diagnoses in the family. Therefore, it is important to remain in touch with cancer genetics in the future so that we can continue to offer Mr. Teems the most up to date genetic testing.   Regarding the VUS in SMARCA4: At this time, it is unknown if this variant is associated with increased cancer risk or if this is a normal finding, but most variants such as this get reclassified to being inconsequential. It should not  be used to make medical management decisions. With time, we suspect the lab will determine the significance of this variant, if any. If we do learn more about it, we will try to contact Mr. Mccaslin to discuss it further. However, it is important to stay in touch with [place] periodically and keep the address and phone number up to date.  CHEK2 CHEK2 mutations are associated with an increased risk of breast, colon, and other cancers. The estimated cancer risks vary widely and may be influenced by family history.   Breast Cancer: Women with a CHEK2 deleterious mutation have approximately a 24% (no family history of breast cancer) to 48% (strong family history of breast cancer) lifetime risk of breast cancer and up to a 25% risk of a second breast cancer. Men may have a somewhat increased risk for male breast cancer.  This risk is not nearly as high for men as it is for women, but is not well defined.   According to the NCCN guidelines, women with CHEK2 mutations should consider breast MRI's as a part of regular breast cancer screening, and depending on family history could consider a risk-reducing mastectomy. For women, breast cancer screening should begin at age 33 or 26 years younger than the earliest age at breast cancer diagnosis in the family. Additional publications underscore the appropriateness of breast MRIs and consideration of chemoprevention (tamoxifen) due to the greater than 20% lifetime risk of breast cancer associated with the 1100delC variant (PMID: 16945038, 88280034).   Because there are no specific recommendations regarding breast screening for men with CHEK2 mutations, we often refer to the BRCA screening recommendations as general guidelines on how to screen for this cancer risks.    1. Male Breast self-exam training and education starting at age 34.  2. Clinical breast exam, every 6-12 months, starting at age 61 years  Baseline mammogram can be considered, but there are no guidelines  regarding this screening and the benefits are not well studied.   Colon Cancer: Men and women with CHEK2 mutations have an increased risk of colon cancer (~10% lifetime risk).   Colonoscopy screening every 5 years beginning at age 32 (or more often based on polyp findings/ GI physician recommendations).   If an individual has a first-degree relative with colorectal cancer, screening should begin 10 years prior to the relative's age at diagnosis if before 57.   If an individual has a personal history of colorectal cancer, screening recommendations should be based on recommendations for post-colorectal cancer resection.  Mr. Manzer reports that he is currently having colonoscopies every 5 years based on his personal polyp history. The presence of his CHEK2 mutation and a history of colon polyps may impact his doctors colon screening recommendations.    Prostate Cancer: The risk of prostate cancer in men with a CHEK2 mutation is estimated to be increased up to 30%.   However, the benefits of screening for prostate cancer among men with a pathogenic variant  in [place] are uncertain (PMID: 74718550).     Men may consider prostate cancer screening starting at age 47 years. (BRCA prostate screening NCCN recommendations)  It has been suggested that men with a CHEK2 pathogenic variant and a first-degree relative with prostate cancer have an annual prostate-specific antigen (PSA) analysis (PMID: 15868257).   Other Cancer Risks: CHEK2 mutations may be associated with other malignancies as well such as thyroid, melanoma, bladder, kidney.  The rates vary from family to family and more research needs to be done to figure out specific risks.   Ovarian and uterine cancers have been reported in [place] families but it is currently unknown whether there is an associated increased ovarian or uterine cancer risk.  FAMILY MEMBERS: Mr. Marmolejos son and siblings have a 50% chance risk to have inherited the  familial CHEK2 mutation.  We recommend they have genetic counseling and genetic testing for this familial mutation.    At this time we do not know if this CHEK2 mutation was inherited from Mr. Miano mother's or father's side of the family.  Therefore at this time, we recommend relatives on both sides of the family (aunts/uncles, cousins, etc.) have genetic testing for the familial mutation.  These relatives are at risk to carry the CHEK2 mutation and be at an increased risk for cancer.   Our knowledge of cancer risks related to CHEK2 mutations will continue to evolve. We recommended Mr. Mans follow up with the genetics clinic annually so we can provide him with the most current information about CHEK2 and cancer risk, as well as with any changes to his family history (new cancer diagnoses, genetic test results).  SUPPORT AND RESOURCES:  We provided information about two support groups for hereditary cancer syndrome information and support, Facing Our Risk (www.facingourrisk.com) and Bright Pink (www.brightpink.org) which some people have found useful.  They provide opportunities to speak with other individuals from high-risk families.    PLAN: 1. Mr. Stefanski has requested that these results be sent back to his primary care provider, [facility]) for follow-up for this indication.   2. He reports that Dr. Jason Coop does his colonoscopies, and a copy of these notes will be sent to Dr. Jason Coop as well.  3. Cancer genetics is a rapidly advancing field and it is possible that new genetic tests will be appropriate for him and/or his family members in the future. We encouraged him to remain in contact with cancer genetics on an annual basis so we can update his personal and family histories and let him know of advances in cancer genetics that may benefit this family.    Our contact number was provided. Mr. Miron questions were answered to his satisfaction, and he knows he is welcome to call  us at anytime with additional questions or concerns.   Ferol Luz, MS Genetic Counselor [email or web address]

## 2017-05-08 DIAGNOSIS — L821 Other seborrheic keratosis: Secondary | ICD-10-CM | POA: Diagnosis not present

## 2017-05-08 DIAGNOSIS — Z419 Encounter for procedure for purposes other than remedying health state, unspecified: Secondary | ICD-10-CM | POA: Diagnosis not present

## 2017-06-06 ENCOUNTER — Telehealth: Payer: Self-pay | Admitting: Genetics

## 2017-06-06 NOTE — Telephone Encounter (Signed)
Informed Ryan [name] that medicare does not cover genetic testing for people without any history of cancer themselves, however the laboratory, [facility] has a program in place that they do not bill these patients for these tests.  He should not receive an actual bill for payment from the lab.  If he does receive a bill, I told him to call me because I can clarify this with our [facility] rep, but I do not not expect he or his wife will be billed for the testing by [facility].

## 2017-06-28 DIAGNOSIS — J209 Acute bronchitis, unspecified: Secondary | ICD-10-CM | POA: Diagnosis not present

## 2017-08-06 DIAGNOSIS — R310 Gross hematuria: Secondary | ICD-10-CM | POA: Diagnosis not present

## 2017-08-06 DIAGNOSIS — N200 Calculus of kidney: Secondary | ICD-10-CM | POA: Diagnosis not present

## 2017-08-09 DIAGNOSIS — R310 Gross hematuria: Secondary | ICD-10-CM | POA: Diagnosis not present

## 2017-09-17 DIAGNOSIS — Z125 Encounter for screening for malignant neoplasm of prostate: Secondary | ICD-10-CM | POA: Diagnosis not present

## 2017-09-17 DIAGNOSIS — E7800 Pure hypercholesterolemia, unspecified: Secondary | ICD-10-CM | POA: Diagnosis not present

## 2017-09-17 DIAGNOSIS — Z23 Encounter for immunization: Secondary | ICD-10-CM | POA: Diagnosis not present

## 2017-09-17 DIAGNOSIS — Z0000 Encounter for general adult medical examination without abnormal findings: Secondary | ICD-10-CM | POA: Diagnosis not present

## 2017-09-17 DIAGNOSIS — G4733 Obstructive sleep apnea (adult) (pediatric): Secondary | ICD-10-CM | POA: Diagnosis not present

## 2017-09-17 DIAGNOSIS — I10 Essential (primary) hypertension: Secondary | ICD-10-CM | POA: Diagnosis not present

## 2017-09-17 DIAGNOSIS — Z1159 Encounter for screening for other viral diseases: Secondary | ICD-10-CM | POA: Diagnosis not present

## 2017-09-17 DIAGNOSIS — E559 Vitamin D deficiency, unspecified: Secondary | ICD-10-CM | POA: Diagnosis not present

## 2017-09-17 DIAGNOSIS — H9190 Unspecified hearing loss, unspecified ear: Secondary | ICD-10-CM | POA: Diagnosis not present

## 2017-10-24 DIAGNOSIS — H903 Sensorineural hearing loss, bilateral: Secondary | ICD-10-CM | POA: Diagnosis not present

## 2017-10-24 DIAGNOSIS — H93299 Other abnormal auditory perceptions, unspecified ear: Secondary | ICD-10-CM | POA: Diagnosis not present

## 2017-10-25 DIAGNOSIS — D1801 Hemangioma of skin and subcutaneous tissue: Secondary | ICD-10-CM | POA: Diagnosis not present

## 2017-10-25 DIAGNOSIS — L821 Other seborrheic keratosis: Secondary | ICD-10-CM | POA: Diagnosis not present

## 2017-10-25 DIAGNOSIS — D2262 Melanocytic nevi of left upper limb, including shoulder: Secondary | ICD-10-CM | POA: Diagnosis not present

## 2017-10-25 DIAGNOSIS — B078 Other viral warts: Secondary | ICD-10-CM | POA: Diagnosis not present

## 2017-10-25 DIAGNOSIS — D224 Melanocytic nevi of scalp and neck: Secondary | ICD-10-CM | POA: Diagnosis not present

## 2017-11-05 DIAGNOSIS — H93299 Other abnormal auditory perceptions, unspecified ear: Secondary | ICD-10-CM | POA: Diagnosis not present

## 2017-11-05 DIAGNOSIS — H903 Sensorineural hearing loss, bilateral: Secondary | ICD-10-CM | POA: Diagnosis not present

## 2018-02-20 DIAGNOSIS — L820 Inflamed seborrheic keratosis: Secondary | ICD-10-CM | POA: Diagnosis not present

## 2018-02-25 DIAGNOSIS — H2513 Age-related nuclear cataract, bilateral: Secondary | ICD-10-CM | POA: Diagnosis not present

## 2018-05-30 DIAGNOSIS — Z23 Encounter for immunization: Secondary | ICD-10-CM | POA: Diagnosis not present

## 2018-08-12 DIAGNOSIS — N200 Calculus of kidney: Secondary | ICD-10-CM | POA: Diagnosis not present

## 2018-08-12 DIAGNOSIS — R351 Nocturia: Secondary | ICD-10-CM | POA: Diagnosis not present

## 2018-11-12 DIAGNOSIS — E7800 Pure hypercholesterolemia, unspecified: Secondary | ICD-10-CM | POA: Diagnosis not present

## 2018-11-12 DIAGNOSIS — J301 Allergic rhinitis due to pollen: Secondary | ICD-10-CM | POA: Diagnosis not present

## 2018-11-12 DIAGNOSIS — I10 Essential (primary) hypertension: Secondary | ICD-10-CM | POA: Diagnosis not present

## 2018-11-12 DIAGNOSIS — E559 Vitamin D deficiency, unspecified: Secondary | ICD-10-CM | POA: Diagnosis not present

## 2018-11-12 DIAGNOSIS — G4733 Obstructive sleep apnea (adult) (pediatric): Secondary | ICD-10-CM | POA: Diagnosis not present

## 2018-11-12 DIAGNOSIS — Z0000 Encounter for general adult medical examination without abnormal findings: Secondary | ICD-10-CM | POA: Diagnosis not present

## 2018-11-15 DIAGNOSIS — L821 Other seborrheic keratosis: Secondary | ICD-10-CM | POA: Diagnosis not present

## 2018-11-15 DIAGNOSIS — D1801 Hemangioma of skin and subcutaneous tissue: Secondary | ICD-10-CM | POA: Diagnosis not present

## 2018-11-15 DIAGNOSIS — L820 Inflamed seborrheic keratosis: Secondary | ICD-10-CM | POA: Diagnosis not present

## 2018-11-15 DIAGNOSIS — B078 Other viral warts: Secondary | ICD-10-CM | POA: Diagnosis not present

## 2018-11-15 DIAGNOSIS — L810 Postinflammatory hyperpigmentation: Secondary | ICD-10-CM | POA: Diagnosis not present

## 2018-11-15 DIAGNOSIS — L814 Other melanin hyperpigmentation: Secondary | ICD-10-CM | POA: Diagnosis not present

## 2018-11-18 DIAGNOSIS — E7800 Pure hypercholesterolemia, unspecified: Secondary | ICD-10-CM | POA: Diagnosis not present

## 2018-11-18 DIAGNOSIS — Z125 Encounter for screening for malignant neoplasm of prostate: Secondary | ICD-10-CM | POA: Diagnosis not present

## 2018-11-18 DIAGNOSIS — E559 Vitamin D deficiency, unspecified: Secondary | ICD-10-CM | POA: Diagnosis not present

## 2018-11-18 DIAGNOSIS — I10 Essential (primary) hypertension: Secondary | ICD-10-CM | POA: Diagnosis not present

## 2018-11-18 DIAGNOSIS — Z0000 Encounter for general adult medical examination without abnormal findings: Secondary | ICD-10-CM | POA: Diagnosis not present

## 2019-02-28 DIAGNOSIS — Z23 Encounter for immunization: Secondary | ICD-10-CM | POA: Diagnosis not present

## 2019-03-05 DIAGNOSIS — D3101 Benign neoplasm of right conjunctiva: Secondary | ICD-10-CM | POA: Diagnosis not present

## 2019-03-05 DIAGNOSIS — H43813 Vitreous degeneration, bilateral: Secondary | ICD-10-CM | POA: Diagnosis not present

## 2019-03-05 DIAGNOSIS — H2513 Age-related nuclear cataract, bilateral: Secondary | ICD-10-CM | POA: Diagnosis not present

## 2019-06-18 ENCOUNTER — Ambulatory Visit: Payer: Medicare Other | Attending: Nurse Practitioner

## 2019-06-18 DIAGNOSIS — Z23 Encounter for immunization: Secondary | ICD-10-CM | POA: Insufficient documentation

## 2019-07-07 ENCOUNTER — Ambulatory Visit: Payer: Medicare Other | Attending: Internal Medicine

## 2019-07-07 DIAGNOSIS — Z23 Encounter for immunization: Secondary | ICD-10-CM | POA: Insufficient documentation

## 2019-07-07 NOTE — Progress Notes (Signed)
   Covid-19 Vaccination Clinic  Name:  Ryan [name]    MRN: [id number] DOB: 1951/03/02  07/07/2019  [name] was observed post Covid-19 immunization for 15 minutes without incidence. He was provided with Vaccine Information Sheet and instruction to access the V-Safe system.   [name] was instructed to call 911 with any severe reactions post vaccine: Marland Kitchen Difficulty breathing  . Swelling of your face and throat  . A fast heartbeat  . A bad rash all over your body  . Dizziness and weakness    Immunizations Administered    Name Date Dose VIS Date Route   Pfizer COVID-19 Vaccine 07/07/2019  2:13 PM 0.3 mL 05/09/2019 Intramuscular   Manufacturer: [employer]   Lot: [id number]   [place]: [id number]

## 2019-08-13 DIAGNOSIS — N200 Calculus of kidney: Secondary | ICD-10-CM | POA: Diagnosis not present

## 2019-08-13 DIAGNOSIS — N401 Enlarged prostate with lower urinary tract symptoms: Secondary | ICD-10-CM | POA: Diagnosis not present

## 2019-08-13 DIAGNOSIS — R351 Nocturia: Secondary | ICD-10-CM | POA: Diagnosis not present

## 2019-09-29 DIAGNOSIS — H00014 Hordeolum externum left upper eyelid: Secondary | ICD-10-CM | POA: Diagnosis not present

## 2019-12-09 DIAGNOSIS — F419 Anxiety disorder, unspecified: Secondary | ICD-10-CM | POA: Diagnosis not present

## 2019-12-09 DIAGNOSIS — Z62820 Parent-biological child conflict: Secondary | ICD-10-CM | POA: Diagnosis not present

## 2020-01-27 DIAGNOSIS — J302 Other seasonal allergic rhinitis: Secondary | ICD-10-CM | POA: Diagnosis not present

## 2020-01-27 DIAGNOSIS — R0981 Nasal congestion: Secondary | ICD-10-CM | POA: Diagnosis not present

## 2020-02-23 DIAGNOSIS — Z23 Encounter for immunization: Secondary | ICD-10-CM | POA: Diagnosis not present

## 2020-03-16 DIAGNOSIS — H2513 Age-related nuclear cataract, bilateral: Secondary | ICD-10-CM | POA: Diagnosis not present

## 2020-03-16 DIAGNOSIS — H52203 Unspecified astigmatism, bilateral: Secondary | ICD-10-CM | POA: Diagnosis not present

## 2020-03-16 DIAGNOSIS — D3101 Benign neoplasm of right conjunctiva: Secondary | ICD-10-CM | POA: Diagnosis not present

## 2020-05-04 DIAGNOSIS — N200 Calculus of kidney: Secondary | ICD-10-CM | POA: Diagnosis not present

## 2020-05-17 DIAGNOSIS — N200 Calculus of kidney: Secondary | ICD-10-CM | POA: Diagnosis not present

## 2020-08-23 DIAGNOSIS — N401 Enlarged prostate with lower urinary tract symptoms: Secondary | ICD-10-CM | POA: Diagnosis not present

## 2020-08-23 DIAGNOSIS — N200 Calculus of kidney: Secondary | ICD-10-CM | POA: Diagnosis not present

## 2020-08-23 DIAGNOSIS — R351 Nocturia: Secondary | ICD-10-CM | POA: Diagnosis not present

## 2020-09-06 DIAGNOSIS — Z23 Encounter for immunization: Secondary | ICD-10-CM | POA: Diagnosis not present

## 2020-11-23 DIAGNOSIS — S90862A Insect bite (nonvenomous), left foot, initial encounter: Secondary | ICD-10-CM | POA: Diagnosis not present

## 2020-11-23 DIAGNOSIS — D1801 Hemangioma of skin and subcutaneous tissue: Secondary | ICD-10-CM | POA: Diagnosis not present

## 2020-11-23 DIAGNOSIS — L821 Other seborrheic keratosis: Secondary | ICD-10-CM | POA: Diagnosis not present

## 2020-11-23 DIAGNOSIS — D2371 Other benign neoplasm of skin of right lower limb, including hip: Secondary | ICD-10-CM | POA: Diagnosis not present

## 2020-11-23 DIAGNOSIS — L570 Actinic keratosis: Secondary | ICD-10-CM | POA: Diagnosis not present

## 2020-12-09 DIAGNOSIS — E7800 Pure hypercholesterolemia, unspecified: Secondary | ICD-10-CM | POA: Diagnosis not present

## 2020-12-09 DIAGNOSIS — Z0000 Encounter for general adult medical examination without abnormal findings: Secondary | ICD-10-CM | POA: Diagnosis not present

## 2020-12-09 DIAGNOSIS — Z79899 Other long term (current) drug therapy: Secondary | ICD-10-CM | POA: Diagnosis not present

## 2020-12-09 DIAGNOSIS — E559 Vitamin D deficiency, unspecified: Secondary | ICD-10-CM | POA: Diagnosis not present

## 2020-12-09 DIAGNOSIS — Z125 Encounter for screening for malignant neoplasm of prostate: Secondary | ICD-10-CM | POA: Diagnosis not present

## 2020-12-09 DIAGNOSIS — I10 Essential (primary) hypertension: Secondary | ICD-10-CM | POA: Diagnosis not present

## 2021-01-11 DIAGNOSIS — H43812 Vitreous degeneration, left eye: Secondary | ICD-10-CM | POA: Diagnosis not present

## 2021-02-14 DIAGNOSIS — Z23 Encounter for immunization: Secondary | ICD-10-CM | POA: Diagnosis not present

## 2021-03-23 DIAGNOSIS — H524 Presbyopia: Secondary | ICD-10-CM | POA: Diagnosis not present

## 2021-03-23 DIAGNOSIS — H2513 Age-related nuclear cataract, bilateral: Secondary | ICD-10-CM | POA: Diagnosis not present

## 2021-03-23 DIAGNOSIS — H52203 Unspecified astigmatism, bilateral: Secondary | ICD-10-CM | POA: Diagnosis not present

## 2021-05-10 DIAGNOSIS — U071 COVID-19: Secondary | ICD-10-CM | POA: Diagnosis not present

## 2021-06-27 ENCOUNTER — Other Ambulatory Visit: Payer: Self-pay | Admitting: Family Medicine

## 2021-06-27 DIAGNOSIS — R69 Illness, unspecified: Secondary | ICD-10-CM

## 2021-07-14 ENCOUNTER — Encounter: Payer: Self-pay | Admitting: Orthopedic Surgery

## 2021-07-14 ENCOUNTER — Other Ambulatory Visit: Payer: Self-pay

## 2021-07-14 ENCOUNTER — Ambulatory Visit (INDEPENDENT_AMBULATORY_CARE_PROVIDER_SITE_OTHER): Payer: Medicare Other | Admitting: Orthopedic Surgery

## 2021-07-14 VITALS — BP 140/86 | HR 66 | Temp 97.1°F | Resp 18 | Ht 65.0 in | Wt 168.2 lb

## 2021-07-14 DIAGNOSIS — J310 Chronic rhinitis: Secondary | ICD-10-CM | POA: Diagnosis not present

## 2021-07-14 DIAGNOSIS — E7800 Pure hypercholesterolemia, unspecified: Secondary | ICD-10-CM | POA: Diagnosis not present

## 2021-07-14 DIAGNOSIS — E559 Vitamin D deficiency, unspecified: Secondary | ICD-10-CM

## 2021-07-14 DIAGNOSIS — M85859 Other specified disorders of bone density and structure, unspecified thigh: Secondary | ICD-10-CM

## 2021-07-14 DIAGNOSIS — M8589 Other specified disorders of bone density and structure, multiple sites: Secondary | ICD-10-CM | POA: Diagnosis not present

## 2021-07-14 DIAGNOSIS — Z87442 Personal history of urinary calculi: Secondary | ICD-10-CM | POA: Diagnosis not present

## 2021-07-14 DIAGNOSIS — N400 Enlarged prostate without lower urinary tract symptoms: Secondary | ICD-10-CM | POA: Diagnosis not present

## 2021-07-14 DIAGNOSIS — I10 Essential (primary) hypertension: Secondary | ICD-10-CM | POA: Diagnosis not present

## 2021-07-14 DIAGNOSIS — G4733 Obstructive sleep apnea (adult) (pediatric): Secondary | ICD-10-CM | POA: Diagnosis not present

## 2021-07-14 DIAGNOSIS — M8580 Other specified disorders of bone density and structure, unspecified site: Secondary | ICD-10-CM

## 2021-07-14 NOTE — Progress Notes (Signed)
Careteam: Patient Care Team: Yvonna Alanis, NP as PCP - General (Adult Health Nurse Practitioner) Irine Seal, MD as Attending Physician (Urology) Sydnee Levans, MD as Referring Physician (Dermatology)  Seen by: Windell Moulding, AGNP-C  PLACE OF SERVICE:  [place] Directive information Does Patient Have a Medical Advance Directive?: No, Would patient like information on creating a medical advance directive?: No - Patient declined  No Known Allergies  Chief Complaint  Patient presents with   Establish Care    New patient establishing care.      HPI: Patient is a 71 y.o. male seen today to establish at [facility].   Previous PCP was Dr. Lujean Amel. He wanted a provider who specialized in geriatrics. Lived in [place] since 1975. Married. 2 children. Retired, Dance movement psychotherapist. Lives in multilevel home, 2 steps to get into home. Likes to write in his free time. Also attends church and sings in choir.   HTN- diagnosed in 40's. Taking amlodipine. Checks blood pressure daily. Averages SBP 130-140's, DBP 80's. Admits to using salt when cooking.   Sleep apnea- diagnosed in 50's, uses CPAP, no complaints, sleeping well  Osteopenia- has had 2 bone density tests in past few years at [facility], reports osteopenia, remains on calcium and vitamin D supplement, no history of fracture  HLD- reports past cholesterol 201, taking red yeast rice and follows low fat diet  Hematuria- followed by Dr. Jeffie Pollock, CT done in past- renal kidney stones, multiple episodes on kidney stones in past  Enlarged prostate- followed by Dr. Jeffie Pollock, using saw palmetto, trouble starting and stopping flow, uses bathroom once at night  Rhinitis- seen by ENT in past, advised to use Flonase and Claritin  Forgetful- reports forgetting names, discussed MMSE for next visit  Past surgeries:  Tonsillectomy- 1972  Vasectomy- 1981  No past hospitalizations or injuries.   Prior  procedures:  Colonoscopy- 2014 at [facility]  Bone density- 2017  UTD on vaccinations.   Family history:  Father- deceased at age 22- [place]'s disease/ hx: melanoma  Mother- deceased at age 53- stroke/ hx: diabetes  Brother- age 56- hx: non hodgkin's lymphoma/ stoke  Brother- age 44- hx: hypertension  Smoking- smoked 1 PPD x 4 years during college Alcohol- drinks about 2 beers/week, no history of abuse Illicit drugs- no past use or abuse Diet- drinks about 3 cups coffee daily, eats 2-3 servings of fruits/vegetables, does not eat beef, eats fish/turkey/chicken Exercise- exercises daily- walking > 3 miles daily, also does push ups and planks  Eyes exam- followed by Dr.Mccuen, has eyes examined yearly Dental exam- followed by Dr. Belva Agee, last cleaning 05/2021  Advanced directives completed.       Review of Systems:  Review of Systems  Constitutional:  Negative for chills, fever, malaise/fatigue and weight loss.  HENT:  Positive for congestion and tinnitus. Negative for hearing loss.   Eyes:  Negative for blurred vision and double vision.  Respiratory:  Negative for cough, shortness of breath and wheezing.   Cardiovascular:  Negative for chest pain, orthopnea and leg swelling.  Gastrointestinal:  Negative for abdominal pain, blood in stool, constipation, diarrhea, heartburn, nausea and vomiting.       Flatulence  Genitourinary:  Positive for hematuria. Negative for dysuria and frequency.       Kidney stones  Musculoskeletal:  Negative for falls and joint pain.  Skin:  Negative for rash.  Neurological:  Negative for dizziness, weakness and headaches.  Psychiatric/Behavioral:  Negative  for depression and memory loss. The patient is not nervous/anxious and does not have insomnia.    Past Medical History:  Diagnosis Date   Family history of breast cancer    Family history of genetic disease carrier 04/02/2017   Daughter has a CHEK2 c.1100delC pathogenic variant.    Family  history of melanoma    Family history of non-Hodgkin's lymphoma    Family hx of melanoma    History of bone density study 2017   Hx of adenomatous colonic polyps    and hyperplastic   Hypertension    Kidney stones    Osteopenia    Peptic ulcer disease    Sleep apnea    CPAP   Vitamin D deficiency    Past Surgical History:  Procedure Laterality Date   COLONOSCOPY  2009,2015   MOUTH SURGERY  2014   TONSILLECTOMY AND ADENOIDECTOMY  1972   VASECTOMY  1981   Social History:   reports that he quit smoking about 51 years ago. His smoking use included cigarettes. He does not have any smokeless tobacco history on file. He reports current alcohol use. No history on file for drug use.  Family History  Problem Relation Age of Onset   Broken bones Father    Melanoma Brother 25   Non-Hodgkin's lymphoma Brother 52   CAD Brother    Breast cancer Daughter 15       CHEK2+   Heart attack Paternal Uncle 44   Parkinson's disease Maternal Grandmother    Cancer Cousin 30       type unk    Medications: Patient's Medications  New Prescriptions   No medications on file  Previous Medications   AMLODIPINE (NORVASC) 5 MG TABLET    Take 5 mg by mouth daily.   B COMPLEX-C (B COMPLEX-VITAMIN C PO)    Take 500 mg by mouth daily.   COENZYME Q10 (CO Q-10) 100 MG CAPS    Take 1 capsule by mouth daily.   DURAPATITE, HYDROXYAPATITE, (CALCIUM HYDROXYAPATITE) POWD    Take 500 mg by mouth daily.   ERGOCALCIFEROL (VITAMIN D2) 50000 UNITS CAPSULE    Take 50,000 Units by mouth once a week.   FLUTICASONE PROPIONATE (FLONASE NA)    Place into the nose.   GINKGO BILOBA 100 MG CAPS    Take 1 capsule by mouth daily.   GLUCOSAMINE-CHONDROITIN PO    Take 1,500 mg by mouth daily.   MAGNESIUM 500 MG CAPS    Take 1 capsule by mouth daily.   MULTIPLE VITAMIN (MULTIVITAMIN) TABLET    Take 1 tablet by mouth daily.   OMEGA-3 FATTY ACIDS (OMEGA 3 PO)    Take 1 tablet by mouth daily.   RED YEAST RICE EXTRACT (RED YEAST  RICE PO)    Take 1,200 mg by mouth daily at 12 noon.   SAW PALMETTO 450 MG CAPS    Take 1 capsule by mouth in the morning and at bedtime.   SODIUM HYALURONATE, ORAL, (HYALURONIC ACID) 100 MG CAPS    Take 1 capsule by mouth in the morning and at bedtime.   TURMERIC PO    Take 800 mg by mouth daily.  Modified Medications   No medications on file  Discontinued Medications   No medications on file    Physical Exam:  Vitals:   07/14/21 1059  Weight: 168 lb 3.2 oz (76.3 kg)  Height: 5' 5"  (1.651 m)   Body mass index is 27.99 kg/m. Wt Readings from  Last 3 Encounters:  07/14/21 168 lb 3.2 oz (76.3 kg)  07/09/15 185 lb 3.2 oz (84 kg)    Physical Exam Vitals reviewed.  Constitutional:      General: He is not in acute distress. HENT:     Head: Normocephalic.  Eyes:     General:        Right eye: No discharge.        Left eye: No discharge.  Neck:     Vascular: No carotid bruit.  Cardiovascular:     Rate and Rhythm: Normal rate and regular rhythm.     Pulses: Normal pulses.     Heart sounds: Normal heart sounds. No murmur heard. Pulmonary:     Effort: Pulmonary effort is normal. No respiratory distress.     Breath sounds: Normal breath sounds. No wheezing.  Abdominal:     General: Bowel sounds are normal. There is no distension.     Palpations: Abdomen is soft.     Tenderness: There is no abdominal tenderness.  Musculoskeletal:     Cervical back: Neck supple.     Right lower leg: No edema.     Left lower leg: No edema.  Lymphadenopathy:     Cervical: No cervical adenopathy.  Skin:    General: Skin is warm and dry.     Capillary Refill: Capillary refill takes less than 2 seconds.  Neurological:     General: No focal deficit present.     Mental Status: He is alert and oriented to person, place, and time.     Motor: No weakness.     Gait: Gait normal.  Psychiatric:        Mood and Affect: Mood normal.        Behavior: Behavior normal.    Labs reviewed: Basic  Metabolic Panel: No results for input(s): NA, K, CL, CO2, GLUCOSE, BUN, CREATININE, CALCIUM, MG, PHOS, TSH in the last 8760 hours. Liver Function Tests: No results for input(s): AST, ALT, ALKPHOS, BILITOT, PROT, ALBUMIN in the last 8760 hours. No results for input(s): LIPASE, AMYLASE in the last 8760 hours. No results for input(s): AMMONIA in the last 8760 hours. CBC: No results for input(s): WBC, NEUTROABS, HGB, HCT, MCV, PLT in the last 8760 hours. Lipid Panel: No results for input(s): CHOL, HDL, LDLCALC, TRIG, CHOLHDL, LDLDIRECT in the last 8760 hours. TSH: No results for input(s): TSH in the last 8760 hours. A1C: No results found for: HGBA1C   Assessment/Plan 1. Vitamin D deficiency - cont vitamin D supplement - Vitamin D, 1,25-dihydroxy; Future  2. Osteopenia of neck of femur, unspecified laterality - medical records requested - cont calcium and vitamin D supplement - DG Bone Density; Future  3. Other specified disorders of bone density and structure, multiple sites - see above - DG Bone Density; Future  4. Primary hypertension - controlled - cont amlodipine - CBC with Differential/Platelet; Future - CMP; Future  5. Pure hypercholesterolemia - LDL unknown - cont red yeast rice - Lipid Panel; Future  6. Benign prostatic hyperplasia without lower urinary tract symptoms - followed by Dr. Jeffie Pollock - cont saw palmetto supplement - PSA; Future  7. Obstructive sleep apnea - cont CPAP  8. History of kidney stones - followed by Dr. Jeffie Pollock - encourage water - limit foods high in calcium  9. Chronic rhinitis - cont Flonase and Claritin  Total time: 50 minutes. Greater than 50% of total time spent doing patient education regarding health maintenance and symptom/medication management.  Future labs/tests: cbc/diff, cmp, lipid panel, PSA, vitamin D level, and MMSE   Next appt: 08/18/2021  Niel Hummer  [facility] [phone]

## 2021-07-14 NOTE — Patient Instructions (Addendum)
May call [facility] more water!!!

## 2021-07-17 DIAGNOSIS — M85859 Other specified disorders of bone density and structure, unspecified thigh: Secondary | ICD-10-CM | POA: Insufficient documentation

## 2021-07-17 DIAGNOSIS — E7800 Pure hypercholesterolemia, unspecified: Secondary | ICD-10-CM | POA: Insufficient documentation

## 2021-07-17 DIAGNOSIS — I10 Essential (primary) hypertension: Secondary | ICD-10-CM | POA: Insufficient documentation

## 2021-07-17 DIAGNOSIS — N400 Enlarged prostate without lower urinary tract symptoms: Secondary | ICD-10-CM | POA: Insufficient documentation

## 2021-07-17 DIAGNOSIS — Z87442 Personal history of urinary calculi: Secondary | ICD-10-CM | POA: Insufficient documentation

## 2021-07-17 DIAGNOSIS — J310 Chronic rhinitis: Secondary | ICD-10-CM | POA: Insufficient documentation

## 2021-07-17 DIAGNOSIS — E559 Vitamin D deficiency, unspecified: Secondary | ICD-10-CM | POA: Insufficient documentation

## 2021-07-17 DIAGNOSIS — G4733 Obstructive sleep apnea (adult) (pediatric): Secondary | ICD-10-CM | POA: Insufficient documentation

## 2021-08-11 ENCOUNTER — Other Ambulatory Visit: Payer: Medicare Other

## 2021-08-11 ENCOUNTER — Other Ambulatory Visit: Payer: Self-pay

## 2021-08-11 DIAGNOSIS — E7800 Pure hypercholesterolemia, unspecified: Secondary | ICD-10-CM | POA: Diagnosis not present

## 2021-08-11 DIAGNOSIS — I10 Essential (primary) hypertension: Secondary | ICD-10-CM

## 2021-08-11 DIAGNOSIS — E559 Vitamin D deficiency, unspecified: Secondary | ICD-10-CM

## 2021-08-11 DIAGNOSIS — N400 Enlarged prostate without lower urinary tract symptoms: Secondary | ICD-10-CM

## 2021-08-17 ENCOUNTER — Encounter: Payer: Self-pay | Admitting: Orthopedic Surgery

## 2021-08-18 ENCOUNTER — Ambulatory Visit (INDEPENDENT_AMBULATORY_CARE_PROVIDER_SITE_OTHER): Payer: Medicare Other | Admitting: Orthopedic Surgery

## 2021-08-18 ENCOUNTER — Encounter: Payer: Self-pay | Admitting: Orthopedic Surgery

## 2021-08-18 ENCOUNTER — Other Ambulatory Visit: Payer: Self-pay

## 2021-08-18 VITALS — BP 136/84 | HR 75 | Temp 97.7°F | Resp 18 | Ht 65.0 in | Wt 165.2 lb

## 2021-08-18 DIAGNOSIS — M85859 Other specified disorders of bone density and structure, unspecified thigh: Secondary | ICD-10-CM

## 2021-08-18 DIAGNOSIS — I10 Essential (primary) hypertension: Secondary | ICD-10-CM

## 2021-08-18 DIAGNOSIS — E7800 Pure hypercholesterolemia, unspecified: Secondary | ICD-10-CM

## 2021-08-18 DIAGNOSIS — G4733 Obstructive sleep apnea (adult) (pediatric): Secondary | ICD-10-CM

## 2021-08-18 DIAGNOSIS — E559 Vitamin D deficiency, unspecified: Secondary | ICD-10-CM | POA: Diagnosis not present

## 2021-08-18 DIAGNOSIS — Z1211 Encounter for screening for malignant neoplasm of colon: Secondary | ICD-10-CM

## 2021-08-18 DIAGNOSIS — N400 Enlarged prostate without lower urinary tract symptoms: Secondary | ICD-10-CM | POA: Diagnosis not present

## 2021-08-18 LAB — COMPREHENSIVE METABOLIC PANEL
AG Ratio: 2.3 (calc) (ref 1.0–2.5)
ALT: 29 U/L (ref 9–46)
AST: 27 U/L (ref 10–35)
Albumin: 4.4 g/dL (ref 3.6–5.1)
Alkaline phosphatase (APISO): 102 U/L (ref 35–144)
BUN: 18 mg/dL (ref 7–25)
CO2: 26 mmol/L (ref 20–32)
Calcium: 10 mg/dL (ref 8.6–10.3)
Chloride: 105 mmol/L (ref 98–110)
Creat: 0.97 mg/dL (ref 0.70–1.28)
Globulin: 1.9 g/dL (calc) (ref 1.9–3.7)
Glucose, Bld: 82 mg/dL (ref 65–99)
Potassium: 4 mmol/L (ref 3.5–5.3)
Sodium: 141 mmol/L (ref 135–146)
Total Bilirubin: 1.3 mg/dL — ABNORMAL HIGH (ref 0.2–1.2)
Total Protein: 6.3 g/dL (ref 6.1–8.1)

## 2021-08-18 LAB — CBC WITH DIFFERENTIAL/PLATELET
Absolute Monocytes: 292 cells/uL (ref 200–950)
Basophils Absolute: 39 cells/uL (ref 0–200)
Basophils Relative: 0.9 %
Eosinophils Absolute: 133 cells/uL (ref 15–500)
Eosinophils Relative: 3.1 %
HCT: 48.3 % (ref 38.5–50.0)
Hemoglobin: 16.6 g/dL (ref 13.2–17.1)
Lymphs Abs: 1011 cells/uL (ref 850–3900)
MCH: 31.7 pg (ref 27.0–33.0)
MCHC: 34.4 g/dL (ref 32.0–36.0)
MCV: 92.2 fL (ref 80.0–100.0)
MPV: 9.6 fL (ref 7.5–12.5)
Monocytes Relative: 6.8 %
Neutro Abs: 2825 cells/uL (ref 1500–7800)
Neutrophils Relative %: 65.7 %
Platelets: 197 10*3/uL (ref 140–400)
RBC: 5.24 10*6/uL (ref 4.20–5.80)
RDW: 12.6 % (ref 11.0–15.0)
Total Lymphocyte: 23.5 %
WBC: 4.3 10*3/uL (ref 3.8–10.8)

## 2021-08-18 LAB — VITAMIN D 1,25 DIHYDROXY
Vitamin D 1, 25 (OH)2 Total: 80 pg/mL — ABNORMAL HIGH (ref 18–72)
Vitamin D2 1, 25 (OH)2: 46 pg/mL
Vitamin D3 1, 25 (OH)2: 34 pg/mL

## 2021-08-18 LAB — LIPID PANEL
Cholesterol: 190 mg/dL (ref <200)
HDL: 77 mg/dL (ref >=40)
LDL Cholesterol (Calc): 97 mg/dL (calc)
Non-HDL Cholesterol (Calc): 113 mg/dL (calc) (ref <130)
Total CHOL/HDL Ratio: 2.5 (calc) (ref <5.0)
Triglycerides: 75 mg/dL (ref <150)

## 2021-08-18 LAB — PSA: PSA: 1.38 ng/mL (ref <=4.00)

## 2021-08-18 MED ORDER — AMLODIPINE BESYLATE 5 MG PO TABS: 5.0000 mg | ORAL_TABLET | Freq: Every day | ORAL | 2 refills | Status: DC

## 2021-08-18 MED ORDER — ERGOCALCIFEROL 1.25 MG (50000 UT) PO CAPS: 50000.0000 [IU] | ORAL_CAPSULE | ORAL | 0 refills | Status: DC

## 2021-08-18 NOTE — Progress Notes (Signed)
? ? ?Careteam: ?Patient Care Team: ?Yvonna Alanis, NP as PCP - General (Adult Health Nurse Practitioner) ?Irine Seal, MD as Attending Physician (Urology) ?Sydnee Levans, MD as Referring Physician (Dermatology) ? ?Seen by: Windell Moulding, AGNP-C ? ?PLACE OF SERVICE:  ?[facility]  ?Advanced Directive information ?Does Patient Have a Medical Advance Directive?: No, Would patient like information on creating a medical advance directive?: No - Patient declined ? ?No Known Allergies ? ?Chief Complaint  ?Patient presents with  ? Follow-up  ?  Patient present today for 4-6 week follow up. Discuss recent labs and MMSE score?  ? Quality Metric Gaps  ?  Discuss the need for Hep C screening, Shingrix vaccine, and colonoscopy, or post pone if patient refuses.   ? ? ? ?HPI: Patient is a 71 y.o. male seen today for medical management of chronic conditions.  ? ?Labs reviewed with patient.  ? ?MMSE completed, score 30/30, correct shapes and clock.  ? ?Vitamin D level elevated at 80. Currently taking 50,000 units weekly.  ? ?Bone density scheduled 12/12/2021.  ? 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 Increased risk for autosomal dominant breast, colon, thyroid and prostate cancers. Colonoscopy recommended every 5 years, not done since 2014. Past procedure done at [facility], would like referral to have done.  ? ?Reflux has improved with dietary precautions.  ? ?Reports having shingles vaccine. Records have not been sent from previous provider.  ? ?No health concerns today.  ? ?Review of Systems:  ?Review of Systems  ?Constitutional:  Negative for chills, fever, malaise/fatigue and weight loss.  ?HENT:  Negative for congestion and sore throat.   ?Eyes:  Negative for blurred vision and double vision.  ?Respiratory:  Negative for cough, shortness of breath and wheezing.   ?Cardiovascular:  Negative for chest pain and leg swelling.  ?Gastrointestinal:  Positive for constipation and heartburn. Negative for abdominal pain, blood in  stool, diarrhea, nausea and vomiting.  ?Genitourinary:  Negative for dysuria, frequency and hematuria.  ?Musculoskeletal:  Positive for joint pain. Negative for falls.  ?Skin: Negative.   ?Neurological:  Negative for dizziness, weakness and headaches.  ?Psychiatric/Behavioral:  Negative for depression. The patient is not nervous/anxious and does not have insomnia.   ? ?Past Medical History:  ?Diagnosis Date  ? Family history of breast cancer   ? Family history of genetic disease carrier 04/02/2017  ? Daughter has a CHEK2 c.1100delC pathogenic variant.   ? Family history of melanoma   ? Family history of non-Hodgkin's lymphoma   ? Family hx of melanoma   ? History of bone density study 2017  ? Hx of adenomatous colonic polyps   ? and hyperplastic  ? Hypertension   ? Kidney stones   ? Osteopenia   ? Peptic ulcer disease   ? Sleep apnea   ? CPAP  ? Vitamin D deficiency   ? ?Past Surgical History:  ?Procedure Laterality Date  ? COLONOSCOPY  [id number]  ? MOUTH SURGERY  2014  ? [place]  ? VASECTOMY  1981  ? ?Social History: ?  reports that he quit smoking about 51 years ago. His smoking use included cigarettes. He has never used smokeless tobacco. He reports current alcohol use. He reports that he does not use drugs. ? ?Family History  ?Problem Relation Age of Onset  ? Broken bones Father   ? Melanoma Brother 58  ? Non-Hodgkin's lymphoma Brother 48  ? CAD Brother   ? Breast cancer Daughter 54  ?  CHEK2+  ? Heart attack Paternal Uncle 39  ? Parkinson's disease Maternal Grandmother   ? Cancer Cousin 80  ?     type unk  ? ? ?Medications: ?Patient's Medications  ?New Prescriptions  ? No medications on file  ?Previous Medications  ? AMLODIPINE (NORVASC) 5 MG TABLET    Take 5 mg by mouth daily.  ? B COMPLEX-C (B COMPLEX-VITAMIN C PO)    Take 500 mg by mouth daily.  ? COENZYME Q10 (CO Q-10) 100 MG CAPS    Take 1 capsule by mouth daily.  ? DURAPATITE, HYDROXYAPATITE, (CALCIUM HYDROXYAPATITE) POWD     Take 500 mg by mouth daily.  ? ERGOCALCIFEROL (VITAMIN D2) 50000 UNITS CAPSULE    Take 50,000 Units by mouth once a week.  ? FLUTICASONE PROPIONATE (FLONASE NA)    Place into the nose as needed.  ? GINKGO BILOBA 100 MG CAPS    Take 1 capsule by mouth daily.  ? GLUCOSAMINE-CHONDROITIN PO    Take 1,500 mg by mouth daily.  ? LORATADINE (CLARITIN PO)    Take by mouth as needed.  ? MAGNESIUM 500 MG CAPS    Take 1 capsule by mouth daily.  ? MULTIPLE VITAMIN (MULTIVITAMIN) TABLET    Take 1 tablet by mouth daily.  ? OMEGA-3 FATTY ACIDS (OMEGA 3 PO)    Take 1 tablet by mouth daily.  ? RED YEAST RICE EXTRACT (RED YEAST RICE PO)    Take 1,200 mg by mouth daily at 12 noon.  ? SAW PALMETTO 450 MG CAPS    Take 1 capsule by mouth in the morning and at bedtime.  ? SODIUM HYALURONATE, ORAL, (HYALURONIC ACID) 100 MG CAPS    Take 1 capsule by mouth in the morning and at bedtime.  ? TURMERIC PO    Take 800 mg by mouth daily.  ?Modified Medications  ? No medications on file  ?Discontinued Medications  ? No medications on file  ? ? ?Physical Exam: ? ?There were no vitals filed for this visit. ?There is no height or weight on file to calculate BMI. ?Wt Readings from Last 3 Encounters:  ?07/14/21 168 lb 3.2 oz (76.3 kg)  ?07/09/15 185 lb 3.2 oz (84 kg)  ? ? ?Physical Exam ?Vitals reviewed.  ?Constitutional:   ?   General: He is not in acute distress. ?HENT:  ?   Head: Normocephalic.  ?   Right Ear: Tympanic membrane normal. There is no impacted cerumen.  ?   Left Ear: Tympanic membrane normal. There is no impacted cerumen.  ?   Nose: Nose normal.  ?   Mouth/Throat:  ?   Mouth: Mucous membranes are moist.  ?   Pharynx: No oropharyngeal exudate or posterior oropharyngeal erythema.  ?Eyes:  ?   General:     ?   Right eye: No discharge.     ?   Left eye: No discharge.  ?Neck:  ?   Vascular: No carotid bruit.  ?Cardiovascular:  ?   Rate and Rhythm: Normal rate and regular rhythm.  ?   Pulses: Normal pulses.  ?   Heart sounds: Normal heart  sounds. No murmur heard. ?Pulmonary:  ?   Effort: Pulmonary effort is normal. No respiratory distress.  ?   Breath sounds: Normal breath sounds. No wheezing.  ?Abdominal:  ?   General: Bowel sounds are normal. There is no distension.  ?   Palpations: Abdomen is soft.  ?   Tenderness: There is no abdominal  tenderness.  ?Musculoskeletal:  ?   Cervical back: Neck supple.  ?   Right lower leg: No edema.  ?   Left lower leg: No edema.  ?Lymphadenopathy:  ?   Cervical: No cervical adenopathy.  ?Skin: ?   General: Skin is warm and dry.  ?   Capillary Refill: Capillary refill takes less than 2 seconds.  ?Neurological:  ?   General: No focal deficit present.  ?   Mental Status: He is alert and oriented to person, place, and time.  ?   Coordination: Coordination normal.  ?   Gait: Gait normal.  ?   Deep Tendon Reflexes: Reflexes normal.  ?Psychiatric:     ?   Mood and Affect: Mood normal.     ?   Behavior: Behavior normal.  ? ? ?Labs reviewed: ?Basic Metabolic Panel: ?Recent Labs  ?  08/11/21 ?0921  ?NA 141  ?K 4.0  ?CL 105  ?CO2 26  ?GLUCOSE 82  ?BUN 18  ?CREATININE 0.97  ?CALCIUM 10.0  ? ?Liver Function Tests: ?Recent Labs  ?  08/11/21 ?0921  ?AST 27  ?ALT 29  ?BILITOT 1.3*  ?PROT 6.3  ? ?No results for input(s): LIPASE, AMYLASE in the last 8760 hours. ?No results for input(s): AMMONIA in the last 8760 hours. ?CBC: ?Recent Labs  ?  08/11/21 ?0921  ?WBC 4.3  ?NEUTROABS 2,825  ?HGB 16.6  ?HCT 48.3  ?MCV 92.2  ?PLT 197  ? ?Lipid Panel: ?Recent Labs  ?  08/11/21 ?0921  ?CHOL 190  ?HDL 77  ?[place] 97  ?TRIG 75  ?CHOLHDL 2.5  ? ?TSH: ?No results for input(s): TSH in the last 8760 hours. ?A1C: ?No results found for: HGBA1C ? ? ?Assessment/Plan ?1. Encounter for screening colonoscopy ?- CHEK2 variant positive- increased risk for colon cancer- 5 year screening recommended ?- colonoscopy not done since 2014 ?- Ambulatory referral to Gastroenterology ? ?2. Primary hypertension ?- controlled ?- BUN/creat 18/0.97 08/11/2021 ?-  amLODipine (NORVASC) 5 MG tablet; Take 1 tablet (5 mg total) by mouth daily.  Dispense: 90 tablet; Refill: 2 ? ?3. Vitamin D deficiency ?- vitamin D1 80 08/11/2021 ?- recommend vitamin D po every other week ?-

## 2021-08-18 NOTE — Patient Instructions (Signed)
Please have colonoscopy this year ? ?Please take vitamin D 50,000 every other week ? ?Please [facility] to send medical records.  ?

## 2021-10-04 DIAGNOSIS — R350 Frequency of micturition: Secondary | ICD-10-CM | POA: Diagnosis not present

## 2021-10-04 DIAGNOSIS — N200 Calculus of kidney: Secondary | ICD-10-CM | POA: Diagnosis not present

## 2021-10-04 DIAGNOSIS — N401 Enlarged prostate with lower urinary tract symptoms: Secondary | ICD-10-CM | POA: Diagnosis not present

## 2021-10-10 ENCOUNTER — Encounter: Payer: Self-pay | Admitting: Orthopedic Surgery

## 2021-10-25 DIAGNOSIS — G4733 Obstructive sleep apnea (adult) (pediatric): Secondary | ICD-10-CM | POA: Diagnosis not present

## 2021-11-06 DIAGNOSIS — G4730 Sleep apnea, unspecified: Secondary | ICD-10-CM | POA: Diagnosis not present

## 2021-11-24 DIAGNOSIS — D2261 Melanocytic nevi of right upper limb, including shoulder: Secondary | ICD-10-CM | POA: Diagnosis not present

## 2021-11-24 DIAGNOSIS — S30861A Insect bite (nonvenomous) of abdominal wall, initial encounter: Secondary | ICD-10-CM | POA: Diagnosis not present

## 2021-11-24 DIAGNOSIS — L814 Other melanin hyperpigmentation: Secondary | ICD-10-CM | POA: Diagnosis not present

## 2021-11-24 DIAGNOSIS — L821 Other seborrheic keratosis: Secondary | ICD-10-CM | POA: Diagnosis not present

## 2021-11-24 DIAGNOSIS — L738 Other specified follicular disorders: Secondary | ICD-10-CM | POA: Diagnosis not present

## 2021-11-24 DIAGNOSIS — D1801 Hemangioma of skin and subcutaneous tissue: Secondary | ICD-10-CM | POA: Diagnosis not present

## 2021-11-24 DIAGNOSIS — L918 Other hypertrophic disorders of the skin: Secondary | ICD-10-CM | POA: Diagnosis not present

## 2021-11-24 DIAGNOSIS — S20461A Insect bite (nonvenomous) of right back wall of thorax, initial encounter: Secondary | ICD-10-CM | POA: Diagnosis not present

## 2021-12-12 ENCOUNTER — Ambulatory Visit
Admission: RE | Admit: 2021-12-12 | Discharge: 2021-12-12 | Disposition: A | Discharge status: Home / Self Care | Payer: Medicare Other | Source: Ambulatory Visit | Attending: Orthopedic Surgery | Admitting: Orthopedic Surgery

## 2021-12-12 DIAGNOSIS — M810 Age-related osteoporosis without current pathological fracture: Secondary | ICD-10-CM | POA: Diagnosis not present

## 2021-12-12 DIAGNOSIS — M85851 Other specified disorders of bone density and structure, right thigh: Secondary | ICD-10-CM | POA: Diagnosis not present

## 2021-12-12 DIAGNOSIS — M8589 Other specified disorders of bone density and structure, multiple sites: Secondary | ICD-10-CM

## 2021-12-12 DIAGNOSIS — M85859 Other specified disorders of bone density and structure, unspecified thigh: Secondary | ICD-10-CM

## 2021-12-12 DIAGNOSIS — E559 Vitamin D deficiency, unspecified: Secondary | ICD-10-CM

## 2021-12-12 DIAGNOSIS — M85852 Other specified disorders of bone density and structure, left thigh: Secondary | ICD-10-CM | POA: Diagnosis not present

## 2021-12-30 DIAGNOSIS — D124 Benign neoplasm of descending colon: Secondary | ICD-10-CM | POA: Diagnosis not present

## 2021-12-30 DIAGNOSIS — D123 Benign neoplasm of transverse colon: Secondary | ICD-10-CM | POA: Diagnosis not present

## 2021-12-30 DIAGNOSIS — Z09 Encounter for follow-up examination after completed treatment for conditions other than malignant neoplasm: Secondary | ICD-10-CM | POA: Diagnosis not present

## 2021-12-30 DIAGNOSIS — Z86010 Personal history of colonic polyps: Secondary | ICD-10-CM | POA: Diagnosis not present

## 2021-12-30 DIAGNOSIS — K649 Unspecified hemorrhoids: Secondary | ICD-10-CM | POA: Diagnosis not present

## 2021-12-30 DIAGNOSIS — D122 Benign neoplasm of ascending colon: Secondary | ICD-10-CM | POA: Diagnosis not present

## 2021-12-30 DIAGNOSIS — K5730 Diverticulosis of large intestine without perforation or abscess without bleeding: Secondary | ICD-10-CM | POA: Diagnosis not present

## 2022-01-03 DIAGNOSIS — D124 Benign neoplasm of descending colon: Secondary | ICD-10-CM | POA: Diagnosis not present

## 2022-01-03 DIAGNOSIS — D123 Benign neoplasm of transverse colon: Secondary | ICD-10-CM | POA: Diagnosis not present

## 2022-01-03 DIAGNOSIS — D122 Benign neoplasm of ascending colon: Secondary | ICD-10-CM | POA: Diagnosis not present

## 2022-02-13 DIAGNOSIS — Z23 Encounter for immunization: Secondary | ICD-10-CM | POA: Diagnosis not present

## 2022-02-16 ENCOUNTER — Encounter: Payer: Self-pay | Admitting: Orthopedic Surgery

## 2022-02-16 ENCOUNTER — Ambulatory Visit (INDEPENDENT_AMBULATORY_CARE_PROVIDER_SITE_OTHER): Payer: Medicare Other | Admitting: Orthopedic Surgery

## 2022-02-16 VITALS — BP 138/70 | HR 69 | Temp 97.7°F | Ht 65.0 in | Wt 166.4 lb

## 2022-02-16 DIAGNOSIS — Z87442 Personal history of urinary calculi: Secondary | ICD-10-CM

## 2022-02-16 DIAGNOSIS — I10 Essential (primary) hypertension: Secondary | ICD-10-CM

## 2022-02-16 DIAGNOSIS — M85859 Other specified disorders of bone density and structure, unspecified thigh: Secondary | ICD-10-CM

## 2022-02-16 DIAGNOSIS — E7800 Pure hypercholesterolemia, unspecified: Secondary | ICD-10-CM

## 2022-02-16 DIAGNOSIS — Z13828 Encounter for screening for other musculoskeletal disorder: Secondary | ICD-10-CM

## 2022-02-16 DIAGNOSIS — N400 Enlarged prostate without lower urinary tract symptoms: Secondary | ICD-10-CM | POA: Diagnosis not present

## 2022-02-16 DIAGNOSIS — Z1159 Encounter for screening for other viral diseases: Secondary | ICD-10-CM | POA: Diagnosis not present

## 2022-02-16 DIAGNOSIS — G4733 Obstructive sleep apnea (adult) (pediatric): Secondary | ICD-10-CM | POA: Diagnosis not present

## 2022-02-16 NOTE — Patient Instructions (Signed)
Please obtain vaccination record from [facility] and bring to next visit  Next visit fasting labs- schedule early AM appointment with me   Ok to take stool softener

## 2022-02-16 NOTE — Progress Notes (Signed)
Careteam: Patient Care Team: Yvonna Alanis, NP as PCP - General (Adult Health Nurse Practitioner) Irine Seal, MD as Attending Physician (Urology) Sydnee Levans, MD as Referring Physician (Dermatology)  Seen by: Windell Moulding, AGNP-C  PLACE OF SERVICE:  [place] Directive information    No Known Allergies  Chief Complaint  Patient presents with   Medical Management of Chronic Issues    Patient presents today for a 6 month follow-up   Quality Metric Gaps    Hep C screening, zoster, COVID #7     HPI: Patient is a 71 y.o. male seen today for medical management of chronic conditions.   HTN- BUN/creat 18/0.97 08/11/2021, remains on amlodipine  Osteopenia- DEXA 11/2021, tscore -1.5, remains on vitamin D and calcium hydroxyapatite  HLD- total cholesterol 190, LDL 97, remains on red yeast rice  BPH/renal calculi- followed by Dr. Jeffie Pollock, he is taking "super beta prostate" and saw palmetto, some urinary frequency but also increased water intake, no recent kidney stones.   09/2021 imaging studies ordered by urology noted Scoliosis, denies back pain, treatment options discussed- hold off on seeing specialist  Muscle tightness in evening relieved with tylenol/votaren gel/biofreeze  Followed by ENT for OSA- received new CPAP machine recently  9/18 flu vaccine and [place] covid booster given at [employer], tolerated well.     Review of Systems:  Review of Systems  Constitutional:  Negative for chills, fever, malaise/fatigue and weight loss.  HENT:  Negative for congestion and hearing loss.   Eyes:  Negative for discharge and redness.  Respiratory:  Negative for cough, shortness of breath and wheezing.   Cardiovascular:  Negative for chest pain and leg swelling.  Gastrointestinal:  Negative for abdominal pain, blood in stool, constipation, diarrhea, heartburn, nausea and vomiting.  Genitourinary:  Positive for frequency. Negative for dysuria.  Musculoskeletal:   Positive for joint pain. Negative for back pain and falls.       Muscle tightness  Skin:  Negative for rash.  Neurological:  Negative for dizziness, weakness and headaches.  Psychiatric/Behavioral:  Negative for depression. The patient is not nervous/anxious and does not have insomnia.     Past Medical History:  Diagnosis Date   Family history of breast cancer    Family history of genetic disease carrier 04/02/2017   Daughter has a CHEK2 c.1100delC pathogenic variant.    Family history of melanoma    Family history of non-Hodgkin's lymphoma    Family hx of melanoma    History of bone density study 2017   Hx of adenomatous colonic polyps    and hyperplastic   Hypertension    Kidney stones    Osteopenia    Peptic ulcer disease    Sleep apnea    CPAP   Vitamin D deficiency    Past Surgical History:  Procedure Laterality Date   COLONOSCOPY  2009,2015   MOUTH SURGERY  2014   TONSILLECTOMY AND ADENOIDECTOMY  1972   VASECTOMY  1981   Social History:   reports that he quit smoking about 51 years ago. His smoking use included cigarettes. He has never used smokeless tobacco. He reports current alcohol use. He reports that he does not use drugs.  Family History  Problem Relation Age of Onset   Broken bones Father    Melanoma Brother 66   Non-Hodgkin's lymphoma Brother 10   CAD Brother    Breast cancer Daughter 71       CHEK2+   Heart  attack Paternal Uncle 45   Parkinson's disease Maternal Grandmother    Cancer Cousin 30       type unk    Medications: Patient's Medications  New Prescriptions   No medications on file  Previous Medications   AMLODIPINE (NORVASC) 5 MG TABLET    Take 1 tablet (5 mg total) by mouth daily.   B COMPLEX-C (B COMPLEX-VITAMIN C PO)    Take 500 mg by mouth daily.   COENZYME Q10 (CO Q-10) 100 MG CAPS    Take 1 capsule by mouth daily.   DURAPATITE, HYDROXYAPATITE, (CALCIUM HYDROXYAPATITE) POWD    Take 500 mg by mouth daily.   ERGOCALCIFEROL  (VITAMIN D2) 1.25 MG (50000 UT) CAPSULE    Take 1 capsule (50,000 Units total) by mouth every 14 (fourteen) days.   FLUTICASONE PROPIONATE (FLONASE NA)    Place into the nose as needed.   GINKGO BILOBA 100 MG CAPS    Take 1 capsule by mouth daily.   GLUCOSAMINE-CHONDROITIN PO    Take 1,500 mg by mouth daily.   LORATADINE (CLARITIN PO)    Take by mouth as needed.   MAGNESIUM 500 MG CAPS    Take 1 capsule by mouth daily.   MULTIPLE VITAMIN (MULTIVITAMIN) TABLET    Take 1 tablet by mouth daily.   OMEGA-3 FATTY ACIDS (OMEGA 3 PO)    Take 1 tablet by mouth daily.   OVER THE COUNTER MEDICATION    Super beta prostate 1 capsule twice daily   RED YEAST RICE EXTRACT (RED YEAST RICE PO)    Take 1,200 mg by mouth daily at 12 noon.   SAW PALMETTO 450 MG CAPS    Take 1 capsule by mouth in the morning and at bedtime.   SODIUM HYALURONATE, ORAL, (HYALURONIC ACID) 100 MG CAPS    Take 1 capsule by mouth in the morning and at bedtime.   TURMERIC PO    Take 800 mg by mouth daily.  Modified Medications   No medications on file  Discontinued Medications   No medications on file    Physical Exam:  Vitals:   02/16/22 1054  BP: 138/70  Pulse: 69  Temp: 97.7 F (36.5 C)  SpO2: 98%  Weight: 166 lb 6.4 oz (75.5 kg)  Height: _0  (1.651 m)   Body mass index is 27.69 kg/m. Wt Readings from Last 3 Encounters:  02/16/22 166 lb 6.4 oz (75.5 kg)  08/18/21 165 lb 3.2 oz (74.9 kg)  07/14/21 168 lb 3.2 oz (76.3 kg)    Physical Exam Vitals reviewed.  Constitutional:      General: He is not in acute distress. HENT:     Head: Normocephalic.  Eyes:     General:        Right eye: No discharge.        Left eye: No discharge.  Neck:     Vascular: No carotid bruit.  Cardiovascular:     Rate and Rhythm: Normal rate and regular rhythm.     Pulses: Normal pulses.     Heart sounds: Normal heart sounds.  Pulmonary:     Effort: Pulmonary effort is normal. No respiratory distress.     Breath sounds: Normal  breath sounds. No wheezing.  Abdominal:     General: Bowel sounds are normal. There is no distension.     Palpations: Abdomen is soft.     Tenderness: There is no abdominal tenderness.  Musculoskeletal:     Cervical back: Normal and  neck supple.     Thoracic back: No swelling or deformity. Normal range of motion. Scoliosis present.     Lumbar back: Normal.     Right lower leg: No edema.     Left lower leg: No edema.     Comments: Slight curvature to middle spine on palpation  Lymphadenopathy:     Cervical: No cervical adenopathy.  Skin:    General: Skin is warm and dry.     Capillary Refill: Capillary refill takes less than 2 seconds.  Neurological:     General: No focal deficit present.     Mental Status: He is alert and oriented to person, place, and time.     Motor: No weakness.     Gait: Gait normal.  Psychiatric:        Mood and Affect: Mood normal.        Behavior: Behavior normal.     Labs reviewed: Basic Metabolic Panel: Recent Labs    08/11/21 0921  NA 141  K 4.0  CL 105  CO2 26  GLUCOSE 82  BUN 18  CREATININE 0.97  CALCIUM 10.0   Liver Function Tests: Recent Labs    08/11/21 0921  AST 27  ALT 29  BILITOT 1.3*  PROT 6.3   No results for input(s): "LIPASE", "AMYLASE" in the last 8760 hours. No results for input(s): "AMMONIA" in the last 8760 hours. CBC: Recent Labs    08/11/21 0921  WBC 4.3  NEUTROABS 2,825  HGB 16.6  HCT 48.3  MCV 92.2  PLT 197   Lipid Panel: Recent Labs    08/11/21 0921  CHOL 190  HDL 77  LDLCALC 97  TRIG 75  CHOLHDL 2.5   TSH: No results for input(s): "TSH" in the last 8760 hours. A1C: No results found for: "HGBA1C"   Assessment/Plan 1. Primary hypertension - BUN/creat 18/0.97 08/11/2021 - controlled  - cont amlodipine - cont to limit sodium in diet - CBC with Differential/Platelet - CMP with eGFR([facility])  2. Osteopenia of neck of femur, unspecified laterality - DEXA 11/2021, t score -1.5 - cont  vitamin D and calcium  3. Pure hypercholesterolemia - total cholesterol 190, LDL 97 - cont red yeast rice  4. Obstructive sleep apnea - followed by ENT - cont CPAP qhs  5. Need for hepatitis C screening test - Hep C Antibody  6. Benign prostatic hyperplasia without lower urinary tract symptoms - followed by Dr. Jeffie Pollock - reports some frequency - PSA 1.38 08/11/2021 - cont saw palmetto and beta prostate supplement  7. History of kidney stones - no recent episodes  8. Scoliosis concern - noted on urology imaging study - mild curvature noted to middle back/thoracic region - denies back pain - will hold off on seeing specialist  Total time: 34 minutes. Greater than 50% of total time spent doing patient education regarding HTN, HLD, BPH, scoliosis, and health maintenance.     Next appt: Visit date not found  [facility], [facility] [phone]

## 2022-02-17 LAB — COMPLETE METABOLIC PANEL WITH GFR
AG Ratio: 2.4 (calc) (ref 1.0–2.5)
ALT: 21 U/L (ref 9–46)
AST: 21 U/L (ref 10–35)
Albumin: 4.5 g/dL (ref 3.6–5.1)
Alkaline phosphatase (APISO): 100 U/L (ref 35–144)
BUN: 21 mg/dL (ref 7–25)
CO2: 25 mmol/L (ref 20–32)
Calcium: 10.3 mg/dL (ref 8.6–10.3)
Chloride: 108 mmol/L (ref 98–110)
Creat: 0.93 mg/dL (ref 0.70–1.28)
Globulin: 1.9 g/dL (calc) (ref 1.9–3.7)
Glucose, Bld: 83 mg/dL (ref 65–139)
Potassium: 4.4 mmol/L (ref 3.5–5.3)
Sodium: 141 mmol/L (ref 135–146)
Total Bilirubin: 0.8 mg/dL (ref 0.2–1.2)
Total Protein: 6.4 g/dL (ref 6.1–8.1)
eGFR: 87 mL/min/{1.73_m2} (ref >=60)

## 2022-02-17 LAB — CBC WITH DIFFERENTIAL/PLATELET
Absolute Monocytes: 339 cells/uL (ref 200–950)
Basophils Absolute: 39 cells/uL (ref 0–200)
Basophils Relative: 1 %
Eosinophils Absolute: 121 cells/uL (ref 15–500)
Eosinophils Relative: 3.1 %
HCT: 46.7 % (ref 38.5–50.0)
Hemoglobin: 16.4 g/dL (ref 13.2–17.1)
Lymphs Abs: 1026 cells/uL (ref 850–3900)
MCH: 32.3 pg (ref 27.0–33.0)
MCHC: 35.1 g/dL (ref 32.0–36.0)
MCV: 91.9 fL (ref 80.0–100.0)
MPV: 9.7 fL (ref 7.5–12.5)
Monocytes Relative: 8.7 %
Neutro Abs: 2375 cells/uL (ref 1500–7800)
Neutrophils Relative %: 60.9 %
Platelets: 203 10*3/uL (ref 140–400)
RBC: 5.08 10*6/uL (ref 4.20–5.80)
RDW: 12.2 % (ref 11.0–15.0)
Total Lymphocyte: 26.3 %
WBC: 3.9 10*3/uL (ref 3.8–10.8)

## 2022-02-17 LAB — HEPATITIS C ANTIBODY: Hepatitis C Ab: NONREACTIVE

## 2022-03-08 DIAGNOSIS — G4733 Obstructive sleep apnea (adult) (pediatric): Secondary | ICD-10-CM | POA: Diagnosis not present

## 2022-03-24 DIAGNOSIS — H2513 Age-related nuclear cataract, bilateral: Secondary | ICD-10-CM | POA: Diagnosis not present

## 2022-03-24 DIAGNOSIS — H52203 Unspecified astigmatism, bilateral: Secondary | ICD-10-CM | POA: Diagnosis not present

## 2022-04-04 DIAGNOSIS — H1033 Unspecified acute conjunctivitis, bilateral: Secondary | ICD-10-CM | POA: Diagnosis not present

## 2022-05-04 DIAGNOSIS — L814 Other melanin hyperpigmentation: Secondary | ICD-10-CM | POA: Diagnosis not present

## 2022-05-04 DIAGNOSIS — L820 Inflamed seborrheic keratosis: Secondary | ICD-10-CM | POA: Diagnosis not present

## 2022-05-04 DIAGNOSIS — D1801 Hemangioma of skin and subcutaneous tissue: Secondary | ICD-10-CM | POA: Diagnosis not present

## 2022-05-18 ENCOUNTER — Encounter: Payer: Self-pay | Admitting: Orthopedic Surgery

## 2022-05-18 ENCOUNTER — Ambulatory Visit (INDEPENDENT_AMBULATORY_CARE_PROVIDER_SITE_OTHER): Payer: Medicare Other | Admitting: Orthopedic Surgery

## 2022-05-18 VITALS — BP 124/78 | HR 71 | Temp 96.6°F | Resp 18 | Ht 66.5 in | Wt 167.5 lb

## 2022-05-18 DIAGNOSIS — H6123 Impacted cerumen, bilateral: Secondary | ICD-10-CM

## 2022-05-18 NOTE — Patient Instructions (Signed)
You may purchase Debrox ear drops at local pharmacy- apply 5 drops to each ear prior to bedtime x 5 days> flush ears in shower after debrox use complete

## 2022-05-18 NOTE — Progress Notes (Signed)
Careteam: Patient Care Team: Yvonna Alanis, NP as PCP - General (Adult Health Nurse Practitioner) Irine Seal, MD as Attending Physician (Urology) Sydnee Levans, MD as Referring Physician (Dermatology)  Seen by: Windell Moulding, AGNP-C  PLACE OF SERVICE:  [place] Directive information    No Known Allergies  Chief Complaint  Patient presents with   Medical Management of Chronic Issues    Earwax Build up about three weeks ago feels like water in the ear.     HPI: Patient is a 71 y.o. male seen today for acute visit due to cerumen build up.   H/o cerumen impaction in past. Denies changes to hearing, ear pain or ear drainage. Reports " paper crinkle" sound to left ear a few times. No recent cold or allergies. Afebrile. Vitals stable.   Review of Systems:  Review of Systems  Constitutional:  Negative for chills, fever, malaise/fatigue and weight loss.  HENT:  Negative for ear discharge, ear pain, hearing loss and tinnitus.   Respiratory:  Negative for cough, shortness of breath and wheezing.   Cardiovascular:  Negative for chest pain and leg swelling.  Psychiatric/Behavioral:  Negative for depression. The patient is not nervous/anxious.     Past Medical History:  Diagnosis Date   Family history of breast cancer    Family history of genetic disease carrier 04/02/2017   Daughter has a CHEK2 c.1100delC pathogenic variant.    Family history of melanoma    Family history of non-Hodgkin's lymphoma    Family hx of melanoma    History of bone density study 2017   Hx of adenomatous colonic polyps    and hyperplastic   Hypertension    Kidney stones    Osteopenia    Peptic ulcer disease    Sleep apnea    CPAP   Vitamin D deficiency    Past Surgical History:  Procedure Laterality Date   COLONOSCOPY  2009,2015   MOUTH SURGERY  2014   TONSILLECTOMY AND ADENOIDECTOMY  1972   VASECTOMY  1981   Social History:   reports that he quit smoking about 51 years ago.  His smoking use included cigarettes. He has never used smokeless tobacco. He reports current alcohol use. He reports that he does not use drugs.  Family History  Problem Relation Age of Onset   Broken bones Father    Melanoma Brother 50   Non-Hodgkin's lymphoma Brother 67   CAD Brother    Breast cancer Daughter 64       CHEK2+   Heart attack Paternal Uncle 70   Parkinson's disease Maternal Grandmother    Cancer Cousin 54       type unk    Medications: Patient's Medications  New Prescriptions   No medications on file  Previous Medications   AMLODIPINE (NORVASC) 5 MG TABLET    Take 1 tablet (5 mg total) by mouth daily.   B COMPLEX-C (B COMPLEX-VITAMIN C PO)    Take 500 mg by mouth daily.   COENZYME Q10 (CO Q-10) 100 MG CAPS    Take 1 capsule by mouth daily.   DURAPATITE, HYDROXYAPATITE, (CALCIUM HYDROXYAPATITE) POWD    Take 500 mg by mouth daily.   ERGOCALCIFEROL (VITAMIN D2) 1.25 MG (50000 UT) CAPSULE    Take 1 capsule (50,000 Units total) by mouth every 14 (fourteen) days.   FLUTICASONE PROPIONATE (FLONASE NA)    Place into the nose as needed.   GINKGO BILOBA 100 MG CAPS  Take 1 capsule by mouth daily.   GLUCOSAMINE-CHONDROITIN PO    Take 1,500 mg by mouth daily.   LORATADINE (CLARITIN PO)    Take by mouth as needed.   MAGNESIUM 500 MG CAPS    Take 1 capsule by mouth daily.   MULTIPLE VITAMIN (MULTIVITAMIN) TABLET    Take 1 tablet by mouth daily.   OMEGA-3 FATTY ACIDS (OMEGA 3 PO)    Take 1 tablet by mouth daily.   OVER THE COUNTER MEDICATION    Super beta prostate 1 capsule twice daily   RED YEAST RICE EXTRACT (RED YEAST RICE PO)    Take 1,200 mg by mouth daily at 12 noon.   SAW PALMETTO 450 MG CAPS    Take 1 capsule by mouth in the morning and at bedtime.   SODIUM HYALURONATE, ORAL, (HYALURONIC ACID) 100 MG CAPS    Take 1 capsule by mouth in the morning and at bedtime.   TURMERIC PO    Take 800 mg by mouth daily.  Modified Medications   No medications on file   Discontinued Medications   No medications on file    Physical Exam:  Vitals:   05/18/22 1009  BP: 124/78  Pulse: 71  Resp: 18  Temp: (!) 96.6 F (35.9 C)  SpO2: 97%  Weight: 167 lb 8 oz (76 kg)  Height: 5' 6.5" (1.689 m)   Body mass index is 26.63 kg/m. Wt Readings from Last 3 Encounters:  05/18/22 167 lb 8 oz (76 kg)  02/16/22 166 lb 6.4 oz (75.5 kg)  08/18/21 165 lb 3.2 oz (74.9 kg)    Physical Exam Vitals reviewed.  Constitutional:      General: He is not in acute distress. HENT:     Head: Normocephalic.     Right Ear: There is impacted cerumen.     Left Ear: There is impacted cerumen.     Nose: Nose normal.     Mouth/Throat:     Mouth: Mucous membranes are moist.  Eyes:     General:        Right eye: No discharge.        Left eye: No discharge.  Cardiovascular:     Rate and Rhythm: Normal rate and regular rhythm.     Pulses: Normal pulses.     Heart sounds: Normal heart sounds.  Pulmonary:     Effort: Pulmonary effort is normal. No respiratory distress.     Breath sounds: Normal breath sounds. No wheezing.  Skin:    General: Skin is warm and dry.  Neurological:     General: No focal deficit present.     Mental Status: He is alert and oriented to person, place, and time.  Psychiatric:        Mood and Affect: Mood normal.        Behavior: Behavior normal.     Labs reviewed: Basic Metabolic Panel: Recent Labs    08/11/21 0921 02/16/22 1141  NA 141 141  K 4.0 4.4  CL 105 108  CO2 26 25  GLUCOSE 82 83  BUN 18 21  CREATININE 0.97 0.93  CALCIUM 10.0 10.3   Liver Function Tests: Recent Labs    08/11/21 0921 02/16/22 1141  AST 27 21  ALT 29 21  BILITOT 1.3* 0.8  PROT 6.3 6.4   No results for input(s): "LIPASE", "AMYLASE" in the last 8760 hours. No results for input(s): "AMMONIA" in the last 8760 hours. CBC: Recent Labs  08/11/21 0921 02/16/22 1141  WBC 4.3 3.9  NEUTROABS 2,825 2,375  HGB 16.6 16.4  HCT 48.3 46.7  MCV 92.2  91.9  PLT 197 203   Lipid Panel: Recent Labs    08/11/21 0921  CHOL 190  HDL 77  LDLCALC 97  TRIG 75  CHOLHDL 2.5   TSH: No results for input(s): "TSH" in the last 8760 hours. A1C: No results found for: "HGBA1C"   Assessment/Plan 1. Bilateral impacted cerumen - unable to visualize TM due to cerumen buildup - Ear Lavage - recommend debrox 5 gtts qhs x 5 days every month> flush ears in shower after use   Total time: 15 minutes. Greater than 50% of total time spent doing patient education regarding ear wax prevention and treatment.     Next appt: 08/17/2022  Windell Moulding, [facility] [phone]

## 2022-07-01 ENCOUNTER — Other Ambulatory Visit: Payer: Self-pay | Admitting: Orthopedic Surgery

## 2022-07-01 DIAGNOSIS — I10 Essential (primary) hypertension: Secondary | ICD-10-CM

## 2022-07-06 ENCOUNTER — Encounter: Payer: Self-pay | Admitting: Nurse Practitioner

## 2022-07-06 ENCOUNTER — Ambulatory Visit (INDEPENDENT_AMBULATORY_CARE_PROVIDER_SITE_OTHER): Payer: Medicare Other | Admitting: Nurse Practitioner

## 2022-07-06 ENCOUNTER — Encounter: Payer: Medicare Other | Admitting: Orthopedic Surgery

## 2022-07-06 VITALS — BP 130/78 | HR 61 | Temp 97.0°F | Ht 66.5 in | Wt 168.4 lb

## 2022-07-06 DIAGNOSIS — Z0000 Encounter for general adult medical examination without abnormal findings: Secondary | ICD-10-CM | POA: Diagnosis not present

## 2022-07-06 NOTE — Progress Notes (Signed)
Subjective:   Ryan [name] is a 72 y.o. male who presents for Medicare Annual/Subsequent preventive examination.  Review of Systems     Cardiac Risk Factors include: advanced age (>63mn, >>51women);hypertension     Objective:    Today's Vitals   07/06/22 0933 07/06/22 0958  BP: (!) 140/80 130/78  Pulse: 61   Temp: (!) 97 F (36.1 C)   SpO2: 99%   Weight: 168 lb 6.4 oz (76.4 kg)   Height: 5' 6.5" (1.689 m)    Body mass index is 26.77 kg/m.     07/06/2022    9:33 AM 08/17/2021    3:19 PM 07/14/2021   10:35 AM  Advanced Directives  Does Patient Have a Medical Advance Directive? Yes Yes No  Type of [place]   Does patient want to make changes to medical advance directive? No - Patient declined No - Patient declined   Copy of [place] Chart? Yes - validated most recent copy scanned in chart (See row information) Yes - validated most recent copy scanned in chart (See row information)   Would patient like information on creating a medical advance directive?   No - Patient declined    Current Medications (verified) Outpatient Encounter Medications as of 07/06/2022  Medication Sig   amLODipine (NORVASC) 5 MG tablet TAKE 1 TABLET (5 MG TOTAL) BY MOUTH DAILY.   B Complex-C (B COMPLEX-VITAMIN C PO) Take 500 mg by mouth daily.   Coenzyme Q10 (CO Q-10) 100 MG CAPS Take 1 capsule by mouth daily.   Durapatite, Hydroxyapatite, (CALCIUM HYDROXYAPATITE) POWD Take 500 mg by mouth daily.   ergocalciferol (VITAMIN D2) 1.25 MG (50000 UT) capsule Take 1 capsule (50,000 Units total) by mouth every 14 (fourteen) days.   Fluticasone Propionate (FLONASE NA) Place into the nose as needed.   Ginkgo Biloba 100 MG CAPS Take 1 capsule by mouth daily.   GLUCOSAMINE-CHONDROITIN PO Take 1,500 mg by mouth daily.   Loratadine (CLARITIN PO) Take by mouth as needed.   Multiple Vitamin  (MULTIVITAMIN) tablet Take 1 tablet by mouth daily.   Omega-3 Fatty Acids (OMEGA 3 PO) Take 1 tablet by mouth daily.   OVER THE COUNTER MEDICATION Super beta prostate 1 capsule twice daily   Red Yeast Rice Extract (RED YEAST RICE PO) Take 1,200 mg by mouth daily at 12 noon.   Saw Palmetto 450 MG CAPS Take 1 capsule by mouth in the morning and at bedtime.   Sodium Hyaluronate, oral, (HYALURONIC ACID) 100 MG CAPS Take 1 capsule by mouth in the morning and at bedtime.   TURMERIC PO Take 800 mg by mouth daily.   [DISCONTINUED] Magnesium 500 MG CAPS Take 1 capsule by mouth daily.   No facility-administered encounter medications on file as of 07/06/2022.    Allergies (verified) Patient has no known allergies.   History: Past Medical History:  Diagnosis Date   Family history of breast cancer    Family history of genetic disease carrier 04/02/2017   Daughter has a CHEK2 c.1100delC pathogenic variant.    Family history of melanoma    Family history of non-Hodgkin's lymphoma    Family hx of melanoma    History of bone density study 2017   Hx of adenomatous colonic polyps    and hyperplastic   Hypertension    Kidney stones    Osteopenia    Peptic ulcer disease  Sleep apnea    CPAP   Vitamin D deficiency    Past Surgical History:  Procedure Laterality Date   COLONOSCOPY  2009,2015   MOUTH SURGERY  2014   TONSILLECTOMY AND ADENOIDECTOMY  1972   VASECTOMY  1981   Family History  Problem Relation Age of Onset   Broken bones Father    Melanoma Brother 15   Non-Hodgkin's lymphoma Brother 13   CAD Brother    Breast cancer Daughter 23       CHEK2+   Heart attack Paternal Uncle 55   Parkinson's disease Maternal Grandmother    Cancer Cousin 41       type unk   Social History   Socioeconomic History   Marital status: Married    Spouse name: Not on file   Number of children: 2   Years of education: Not on file   Highest education level: Not on file  Occupational History    Occupation: RETIRED  Tobacco Use   Smoking status: Former    Types: Cigarettes    Quit date: 06/29/1970    Years since quitting: 52.0   Smokeless tobacco: Never  Vaping Use   Vaping Use: Never used  Substance and Sexual Activity   Alcohol use: Yes    Comment: 2 per week.   Drug use: Never   Sexual activity: Not on file  Other Topics Concern   Not on file  Social History Narrative   Tobacco use, amount per day now: None   Past tobacco use, amount per day: 1 pack.   How many years did you use tobacco: 3 years   Alcohol use (drinks per week): 2 per week.    Diet: Watching Calories.    Do you drink/eat things with caffeine: Yes, mainly coffee.   Marital status:   Married                               What year were you married? 1972   Do you live in a house, apartment, assisted living, condo, trailer, etc.? House.   Is it one or more stories? More   How many persons live in your home? 2   Do you have pets in your home?( please list) Dog   Highest Level of education completed? Masters Degree.    Current or past profession: Investment banker, corporate.    Do you exercise?     Yes                             Type and how often? Walk every day, Approximately 3.5 miles.     Do you have a living will? Yes   Do you have a DNR form?                                   If not, do you want to discuss one?   Do you have signed POA/HPOA forms?   Yes                     If so, please bring to you appointment      Do you have any difficulty bathing or dressing yourself? No   Do you have any difficulty preparing food or eating? No   Do you have any difficulty managing your medications? No  Do you have any difficulty managing your finances? No   Do you have any difficulty affording your medications? No   Social Determinants of Radio broadcast assistant Strain: Not on file  Food Insecurity: Not on file  Transportation Needs: Not on file  Physical Activity: Not on file  Stress: Not on file  Social  Connections: Not on file    Tobacco Counseling Counseling given: Not Answered   Clinical Intake:  Pre-visit preparation completed: Yes  Pain : No/denies pain     BMI - recorded: 26.7 Nutritional Status: BMI 25 -29 Overweight Nutritional Risks: None Diabetes: No  How often do you need to have someone help you when you read instructions, pamphlets, or other written materials from your doctor or pharmacy?: 1 - Never  Diabetic?no          Activities of Daily Living    07/06/2022    9:52 AM  In your present state of health, do you have any difficulty performing the following activities:  Hearing? 0  Vision? 0  Difficulty concentrating or making decisions? 0  Walking or climbing stairs? 0  Dressing or bathing? 0  Doing errands, shopping? 0  Preparing Food and eating ? N  Using the Toilet? N  In the past six months, have you accidently leaked urine? N  Do you have problems with loss of bowel control? N  Managing your Medications? N  Managing your Finances? N  Housekeeping or managing your Housekeeping? N    Patient Care Team: Yvonna Alanis, NP as PCP - General (Adult Health Nurse Practitioner) Irine Seal, MD as Attending Physician (Urology) Sydnee Levans, MD as Referring Physician (Dermatology)  Indicate any recent Medical Services you may have received from other than [facility] providers in the past year (date may be approximate).     Assessment:   This is a routine wellness examination for La.  Hearing/Vision screen Hearing Screening - Comments:: Patient has no hearing problems. Vision Screening - Comments:: Patient states that he has no vision problems. Patient had eye exam within past year. Patient sees Dr. Ellie Lunch.  Dietary issues and exercise activities discussed: Current Exercise Habits: Home exercise routine, Type of exercise: stretching;calisthenics;walking, Time (Minutes): 60, Frequency (Times/Week): 6, Weekly Exercise (Minutes/Week): 360   Goals  Addressed   None    Depression Screen    07/06/2022    9:31 AM 08/18/2021   11:02 AM 07/14/2021   11:05 AM  PHQ 2/9 Scores  PHQ - 2 Score 0 0 0    Fall Risk    07/06/2022    9:32 AM 05/18/2022   10:06 AM 02/16/2022   11:02 AM 08/18/2021   11:01 AM 07/14/2021   11:05 AM  Fall Risk   Falls in the past year? 0 0 0 0 0  Number falls in past yr: 0 0 0 0 0  Injury with Fall? 0 0 0 0   Risk for fall due to : No Fall Risks No Fall Risks No Fall Risks No Fall Risks   Follow up Falls evaluation completed Falls evaluation completed  Falls evaluation completed     FALL RISK PREVENTION PERTAINING TO THE HOME:  Any stairs in or around the home? Yes  If so, are there any without handrails? Yes  Home free of loose throw rugs in walkways, pet beds, electrical cords, etc? Yes  Adequate lighting in your home to reduce risk of falls? Yes   ASSISTIVE DEVICES UTILIZED TO PREVENT FALLS:  Life alert?  No  Use of a cane, walker or w/c? No  Grab bars in the bathroom? Yes  Shower chair or bench in shower? No  Elevated toilet seat or a handicapped toilet? Yes   TIMED UP AND GO:  Was the test performed? No .  Gait steady and fast without use of assistive device  Cognitive Function:    07/06/2022    9:43 AM 08/18/2021   11:03 AM  MMSE - Mini Mental State Exam  Orientation to time 5 5  Orientation to Place 5 5  Registration 3 3  Attention/ Calculation 5 5  Recall 3 3  Language- name 2 objects 2 2  Language- repeat 1 1  Language- follow 3 step command 3 3  Language- read & follow direction 1 1  Write a sentence 1 1  Copy design 1 1  Total score 30 30        Immunizations Immunization History  Administered Date(s) Administered   Fluad Quad(high Dose 65+) 02/26/2021, 02/13/2022   Influenza Split 05/05/2013, 02/25/2020   Influenza,inj,Quad PF,6+ Mos 03/17/2016, 02/19/2017, 05/30/2018, 02/28/2019   Influenza,inj,quad, With Preservative 05/19/2014, 03/22/2015   Influenza-Unspecified  02/28/2019   PFIZER(Purple Top)SARS-COV-2 Vaccination 06/18/2019, 07/07/2019, 02/25/2020   Pfizer Covid-19 Vaccine Bivalent Booster 2yr & up 02/23/2020, 09/06/2020, 02/14/2021, 02/13/2022   Pneumococcal Conjugate-13 08/06/2013   Pneumococcal Polysaccharide-23 08/20/2014   Td 12/04/2002   Tdap 01/12/2006, 01/12/2006, 09/17/2017   Zoster Recombinat (Shingrix) 09/11/2016, 12/18/2016   Zoster, Live 03/09/2010    TDAP status: Up to date  Flu Vaccine status: Up to date  Pneumococcal vaccine status: Up to date  Covid-19 vaccine status: Information provided on how to obtain vaccines.   Qualifies for Shingles Vaccine? Yes   Zostavax completed No   Shingrix Completed?: Yes  Screening Tests Health Maintenance  Topic Date Due   COVID-19 Vaccine (8 - 2023-24 season) 04/10/2022   Medicare Annual Wellness (AWV)  07/07/2023   DTaP/Tdap/Td (5 - Td or Tdap) 09/18/2027   COLONOSCOPY (Pts 45-428yrInsurance coverage will need to be confirmed)  12/31/2031   Pneumonia Vaccine 6578Years old  Completed   INFLUENZA VACCINE  Completed   Hepatitis C Screening  Completed   Zoster Vaccines- Shingrix  Completed   HPV VACCINES  Aged Out    Health Maintenance  Health Maintenance Due  Topic Date Due   COVID-19 Vaccine (8 - 2023-24 season) 04/10/2022    Colorectal cancer screening: Type of screening: Colonoscopy. Completed 12/30/21. Repeat every 10 years  Lung Cancer Screening: (Low Dose CT Chest recommended if Age 74-80ears, 30 pack-year currently smoking OR have quit w/in 15years.) does not qualify.   Lung Cancer Screening Referral: na  Additional Screening:  Hepatitis C Screening: does qualify; Completed 2023  Vision Screening: Recommended annual ophthalmology exams for early detection of glaucoma and other disorders of the eye. Is the patient up to date with their annual eye exam?  Yes  Who is the provider or what is the name of the office in which the patient attends annual eye exams  [facility]  If pt is not established with a provider, would they like to be referred to a provider to establish care? No .   Dental Screening: Recommended annual dental exams for proper oral hygiene  Community Resource Referral / Chronic Care Management: CRR required this visit?  No   CCM required this visit?  No      Plan:     I have personally reviewed and noted the following in the  patient's chart:   Medical and social history Use of alcohol, tobacco or illicit drugs  Current medications and supplements including opioid prescriptions. Patient is not currently taking opioid prescriptions. Functional ability and status Nutritional status Physical activity Advanced directives List of other physicians Hospitalizations, surgeries, and ER visits in previous 12 months Vitals Screenings to include cognitive, depression, and falls Referrals and appointments  In addition, I have reviewed and discussed with patient certain preventive protocols, quality metrics, and best practice recommendations. A written personalized care plan for preventive services as well as general preventive health recommendations were provided to patient.     Lauree Chandler, NP   07/06/2022   Place of service: [facility]

## 2022-07-06 NOTE — Patient Instructions (Signed)
Ryan [name] , Thank you for taking time to come for your Medicare Wellness Visit. I appreciate your ongoing commitment to your health goals. Please review the following plan we discussed and let me know if I can assist you in the future.   Screening recommendations/referrals: Colonoscopy up to date Recommended yearly ophthalmology/optometry visit for glaucoma screening and checkup Recommended yearly dental visit for hygiene and checkup  Vaccinations: Influenza vaccine due annually in September/October Pneumococcal vaccine up to date Tdap vaccine up to date Shingles vaccine up to date    Advanced directives: on file  Conditions/risks identified: advanced age.   Next appointment: yearly for awv  Preventive Care 25 Years and Older, Male Preventive care refers to lifestyle choices and visits with your health care provider that can promote health and wellness. What does preventive care include? A yearly physical exam. This is also called an annual well check. Dental exams once or twice a year. Routine eye exams. Ask your health care provider how often you should have your eyes checked. Personal lifestyle choices, including: Daily care of your teeth and gums. Regular physical activity. Eating a healthy diet. Avoiding tobacco and drug use. Limiting alcohol use. Practicing safe sex. Taking low doses of aspirin every day. Taking vitamin and mineral supplements as recommended by your health care provider. What happens during an annual well check? The services and screenings done by your health care provider during your annual well check will depend on your age, overall health, lifestyle risk factors, and family history of disease. Counseling  Your health care provider may ask you questions about your: Alcohol use. Tobacco use. Drug use. Emotional well-being. Home and relationship well-being. Sexual activity. Eating habits. History of falls. Memory and ability to understand  (cognition). Work and work Statistician. Screening  You may have the following tests or measurements: Height, weight, and BMI. Blood pressure. Lipid and cholesterol levels. These may be checked every 5 years, or more frequently if you are over 55 years old. Skin check. Lung cancer screening. You may have this screening every year starting at age 16 if you have a 30-pack-year history of smoking and currently smoke or have quit within the past 15 years. Fecal occult blood test (FOBT) of the stool. You may have this test every year starting at age 28. Flexible sigmoidoscopy or colonoscopy. You may have a sigmoidoscopy every 5 years or a colonoscopy every 10 years starting at age 66. Prostate cancer screening. Recommendations will vary depending on your family history and other risks. Hepatitis C blood test. Hepatitis B blood test. Sexually transmitted disease (STD) testing. Diabetes screening. This is done by checking your blood sugar (glucose) after you have not eaten for a while (fasting). You may have this done every 1-3 years. Abdominal aortic aneurysm (AAA) screening. You may need this if you are a current or former smoker. Osteoporosis. You may be screened starting at age 59 if you are at high risk. Talk with your health care provider about your test results, treatment options, and if necessary, the need for more tests. Vaccines  Your health care provider may recommend certain vaccines, such as: Influenza vaccine. This is recommended every year. Tetanus, diphtheria, and acellular pertussis (Tdap, Td) vaccine. You may need a Td booster every 10 years. Zoster vaccine. You may need this after age 16. Pneumococcal 13-valent conjugate (PCV13) vaccine. One dose is recommended after age 82. Pneumococcal polysaccharide (PPSV23) vaccine. One dose is recommended after age 57. Talk to your health care provider about which  screenings and vaccines you need and how often you need them. This  information is not intended to replace advice given to you by your health care provider. Make sure you discuss any questions you have with your health care provider. Document Released: 06/11/2015 Document Revised: 02/02/2016 Document Reviewed: 03/16/2015 Elsevier Interactive Patient Education  2017 [place] Prevention in the Home Falls can cause injuries. They can happen to people of all ages. There are many things you can do to make your home safe and to help prevent falls. What can I do on the outside of my home? Regularly fix the edges of walkways and driveways and fix any cracks. Remove anything that might make you trip as you walk through a door, such as a raised step or threshold. Trim any bushes or trees on the path to your home. Use bright outdoor lighting. Clear any walking paths of anything that might make someone trip, such as rocks or tools. Regularly check to see if handrails are loose or broken. Make sure that both sides of any steps have handrails. Any raised decks and porches should have guardrails on the edges. Have any leaves, snow, or ice cleared regularly. Use sand or salt on walking paths during winter. Clean up any spills in your garage right away. This includes oil or grease spills. What can I do in the bathroom? Use night lights. Install grab bars by the toilet and in the tub and shower. Do not use towel bars as grab bars. Use non-skid mats or decals in the tub or shower. If you need to sit down in the shower, use a plastic, non-slip stool. Keep the floor dry. Clean up any water that spills on the floor as soon as it happens. Remove soap buildup in the tub or shower regularly. Attach bath mats securely with double-sided non-slip rug tape. Do not have throw rugs and other things on the floor that can make you trip. What can I do in the bedroom? Use night lights. Make sure that you have a light by your bed that is easy to reach. Do not use any sheets or  blankets that are too big for your bed. They should not hang down onto the floor. Have a firm chair that has side arms. You can use this for support while you get dressed. Do not have throw rugs and other things on the floor that can make you trip. What can I do in the kitchen? Clean up any spills right away. Avoid walking on wet floors. Keep items that you use a lot in easy-to-reach places. If you need to reach something above you, use a strong step stool that has a grab bar. Keep electrical cords out of the way. Do not use floor polish or wax that makes floors slippery. If you must use wax, use non-skid floor wax. Do not have throw rugs and other things on the floor that can make you trip. What can I do with my stairs? Do not leave any items on the stairs. Make sure that there are handrails on both sides of the stairs and use them. Fix handrails that are broken or loose. Make sure that handrails are as long as the stairways. Check any carpeting to make sure that it is firmly attached to the stairs. Fix any carpet that is loose or worn. Avoid having throw rugs at the top or bottom of the stairs. If you do have throw rugs, attach them to the floor with carpet  tape. Make sure that you have a light switch at the top of the stairs and the bottom of the stairs. If you do not have them, ask someone to add them for you. What else can I do to help prevent falls? Wear shoes that: Do not have high heels. Have rubber bottoms. Are comfortable and fit you well. Are closed at the toe. Do not wear sandals. If you use a stepladder: Make sure that it is fully opened. Do not climb a closed stepladder. Make sure that both sides of the stepladder are locked into place. Ask someone to hold it for you, if possible. Clearly mark and make sure that you can see: Any grab bars or handrails. First and last steps. Where the edge of each step is. Use tools that help you move around (mobility aids) if they are  needed. These include: Canes. Walkers. Scooters. Crutches. Turn on the lights when you go into a dark area. Replace any light bulbs as soon as they burn out. Set up your furniture so you have a clear path. Avoid moving your furniture around. If any of your floors are uneven, fix them. If there are any pets around you, be aware of where they are. Review your medicines with your doctor. Some medicines can make you feel dizzy. This can increase your chance of falling. Ask your doctor what other things that you can do to help prevent falls. This information is not intended to replace advice given to you by your health care provider. Make sure you discuss any questions you have with your health care provider. Document Released: 03/11/2009 Document Revised: 10/21/2015 Document Reviewed: 06/19/2014 Elsevier Interactive Patient Education  2017 [employer].

## 2022-08-17 ENCOUNTER — Encounter: Payer: Self-pay | Admitting: Orthopedic Surgery

## 2022-08-17 ENCOUNTER — Ambulatory Visit (INDEPENDENT_AMBULATORY_CARE_PROVIDER_SITE_OTHER): Payer: Medicare Other | Admitting: Orthopedic Surgery

## 2022-08-17 VITALS — BP 130/80 | HR 69 | Temp 97.0°F | Resp 16 | Ht 66.5 in | Wt 165.4 lb

## 2022-08-17 DIAGNOSIS — G4733 Obstructive sleep apnea (adult) (pediatric): Secondary | ICD-10-CM

## 2022-08-17 DIAGNOSIS — E7800 Pure hypercholesterolemia, unspecified: Secondary | ICD-10-CM

## 2022-08-17 DIAGNOSIS — N400 Enlarged prostate without lower urinary tract symptoms: Secondary | ICD-10-CM | POA: Diagnosis not present

## 2022-08-17 DIAGNOSIS — I10 Essential (primary) hypertension: Secondary | ICD-10-CM | POA: Diagnosis not present

## 2022-08-17 DIAGNOSIS — M85859 Other specified disorders of bone density and structure, unspecified thigh: Secondary | ICD-10-CM

## 2022-08-17 NOTE — Patient Instructions (Addendum)
Magnesium glycinate  Plan for EKG next visit  Please bring blood pressure machine next visit

## 2022-08-17 NOTE — Progress Notes (Signed)
Careteam: Patient Care Team: Ryan Alanis, NP as PCP - General (Adult Health Nurse Practitioner) Irine Seal, MD as Attending Physician (Urology) Sydnee Levans, MD as Referring Physician (Dermatology)  Seen by: Windell Moulding, AGNP-C  PLACE OF SERVICE:  [place] Directive information Does Patient Have a Medical Advance Directive?: Yes, Type of Advance Directive: [place];Living will, Does patient want to make changes to medical advance directive?: No - Patient declined  No Known Allergies  Chief Complaint  Patient presents with   Medical Management of Chronic Issues    6 month follow up.    Immunizations    Discuss the need for [employer].     HPI: Patient is a 72 y.o. male seen today for medical management of chronic conditions.   No health concerns.   Fasting for lab work today.   No recent falls or injuries. DEXA 2023> osteopenia> remains on calcium/vitamin D supplement.   Followed by Dr. Ellie Lunch eye exam within last year.   Blood pressure stable. Remains on amlodipine. Plans for EKG next visit.   Followed by urology> Dr. Jeffie Pollock improved frequency since changing to Super Beta Prostate.   Using Debrox prn for cerumen buildup. Believes he put incorrect amount in ear and became dizzy. Discussed wife assisting with administration.   Weight stable.   UTD on vaccines.    Review of Systems:  Review of Systems  HENT:  Negative for hearing loss.   Eyes:  Negative for blurred vision and double vision.  Respiratory:  Negative for cough, shortness of breath and wheezing.   Cardiovascular:  Negative for chest pain and leg swelling.  Gastrointestinal:  Positive for constipation and heartburn. Negative for abdominal pain and blood in stool.  Genitourinary:  Negative for dysuria and frequency.  Musculoskeletal:  Negative for falls and joint pain.  Neurological:  Negative for dizziness and headaches.  Psychiatric/Behavioral:  Negative  for depression and memory loss. The patient is not nervous/anxious.     Past Medical History:  Diagnosis Date   Family history of breast cancer    Family history of genetic disease carrier 04/02/2017   Daughter has a CHEK2 c.1100delC pathogenic variant.    Family history of melanoma    Family history of non-Hodgkin's lymphoma    Family hx of melanoma    History of bone density study 2017   Hx of adenomatous colonic polyps    and hyperplastic   Hypertension    Kidney stones    Osteopenia    Peptic ulcer disease    Sleep apnea    CPAP   Vitamin D deficiency    Past Surgical History:  Procedure Laterality Date   COLONOSCOPY  2009,2015   MOUTH SURGERY  2014   TONSILLECTOMY AND ADENOIDECTOMY  1972   VASECTOMY  1981   Social History:   reports that he quit smoking about 52 years ago. His smoking use included cigarettes. He has never used smokeless tobacco. He reports current alcohol use. He reports that he does not use drugs.  Family History  Problem Relation Age of Onset   Broken bones Father    Melanoma Brother 56   Non-Hodgkin's lymphoma Brother 57   CAD Brother    Breast cancer Daughter 59       CHEK2+   Heart attack Paternal Uncle 93   Parkinson's disease Maternal Grandmother    Cancer Cousin 75       type unk    Medications: Patient's  Medications  New Prescriptions   No medications on file  Previous Medications   AMLODIPINE (NORVASC) 5 MG TABLET    TAKE 1 TABLET (5 MG TOTAL) BY MOUTH DAILY.   B COMPLEX-C (B COMPLEX-VITAMIN C PO)    Take 500 mg by mouth daily.   COENZYME Q10 (CO Q-10) 100 MG CAPS    Take 1 capsule by mouth daily.   DURAPATITE, HYDROXYAPATITE, (CALCIUM HYDROXYAPATITE) POWD    Take 500 mg by mouth daily.   ERGOCALCIFEROL (VITAMIN D2) 1.25 MG (50000 UT) CAPSULE    Take 1 capsule (50,000 Units total) by mouth every 14 (fourteen) days.   FLUTICASONE PROPIONATE (FLONASE NA)    Place into the nose as needed.   GINKGO BILOBA 100 MG CAPS    Take 1  capsule by mouth daily.   GLUCOSAMINE-CHONDROITIN PO    Take 1,500 mg by mouth daily.   LORATADINE (CLARITIN PO)    Take by mouth as needed.   MULTIPLE VITAMIN (MULTIVITAMIN) TABLET    Take 1 tablet by mouth daily.   OMEGA-3 FATTY ACIDS (OMEGA 3 PO)    Take 1 tablet by mouth daily.   OVER THE COUNTER MEDICATION    Super beta prostate 1 capsule twice daily   RED YEAST RICE EXTRACT (RED YEAST RICE PO)    Take 1,200 mg by mouth daily at 12 noon.   SODIUM HYALURONATE, ORAL, (HYALURONIC ACID) 100 MG CAPS    Take 1 capsule by mouth in the morning and at bedtime.   TURMERIC PO    Take 800 mg by mouth daily.  Modified Medications   No medications on file  Discontinued Medications   SAW PALMETTO 450 MG CAPS    Take 1 capsule by mouth in the morning and at bedtime.    Physical Exam:  Vitals:   08/17/22 0825  BP: 130/80  Pulse: 69  Resp: 16  Temp: (!) 97 F (36.1 C)  SpO2: 97%  Weight: 165 lb 6.4 oz (75 kg)  Height: 5' 6.5" (1.689 m)   Body mass index is 26.3 kg/m. Wt Readings from Last 3 Encounters:  08/17/22 165 lb 6.4 oz (75 kg)  07/06/22 168 lb 6.4 oz (76.4 kg)  05/18/22 167 lb 8 oz (76 kg)    Physical Exam Vitals reviewed.  Constitutional:      General: He is not in acute distress. HENT:     Head: Normocephalic.     Right Ear: There is impacted cerumen.     Left Ear: There is no impacted cerumen.     Nose: Nose normal.     Mouth/Throat:     Mouth: Mucous membranes are moist.  Eyes:     General:        Right eye: No discharge.        Left eye: No discharge.  Neck:     Vascular: No carotid bruit.  Cardiovascular:     Rate and Rhythm: Normal rate and regular rhythm.     Pulses: Normal pulses.     Heart sounds: Normal heart sounds.  Pulmonary:     Effort: Pulmonary effort is normal.     Breath sounds: Normal breath sounds.  Abdominal:     General: Bowel sounds are normal. There is no distension.     Palpations: Abdomen is soft.     Tenderness: There is no  abdominal tenderness.  Musculoskeletal:     Cervical back: Neck supple.     Right lower leg: No edema.  Left lower leg: No edema.  Skin:    General: Skin is warm and dry.     Capillary Refill: Capillary refill takes less than 2 seconds.  Neurological:     General: No focal deficit present.     Mental Status: He is alert and oriented to person, place, and time.  Psychiatric:        Mood and Affect: Mood normal.        Behavior: Behavior normal.     Labs reviewed: Basic Metabolic Panel: Recent Labs    02/16/22 1141  NA 141  K 4.4  CL 108  CO2 25  GLUCOSE 83  BUN 21  CREATININE 0.93  CALCIUM 10.3   Liver Function Tests: Recent Labs    02/16/22 1141  AST 21  ALT 21  BILITOT 0.8  PROT 6.4   No results for input(s): "LIPASE", "AMYLASE" in the last 8760 hours. No results for input(s): "AMMONIA" in the last 8760 hours. CBC: Recent Labs    02/16/22 1141  WBC 3.9  NEUTROABS 2,375  HGB 16.4  HCT 46.7  MCV 91.9  PLT 203   Lipid Panel: No results for input(s): "CHOL", "HDL", "LDLCALC", "TRIG", "CHOLHDL", "LDLDIRECT" in the last 8760 hours. TSH: No results for input(s): "TSH" in the last 8760 hours. A1C: No results found for: "HGBA1C"   Assessment/Plan 1. Primary hypertension - controlled - BUN/creat 21/0.93 01/2022 - cont amlodipine - recommend EKG next visit   2. Pure hypercholesterolemia - total 190, LDL 97 - cont diet low in fat and avoid fried foods - on red yeast rice  3. Obstructive sleep apnea - cont CPAP qhs  4. Benign prostatic hyperplasia without lower urinary tract symptoms - followed by urology - off [place] - improved symptoms on Super Beta Prostate  5. Osteopenia of neck of femur, unspecified - DEXA due 11/2023  Future labs/tests: EKG in 6 months  Total time: 28 minutes. Greater than 50% of total time spent doing patient education regarding health maintenance, HTN, HLD,  and BPH including symptom/medication management.     Next appt: Visit date not found  [place] [phone]

## 2022-08-18 ENCOUNTER — Other Ambulatory Visit: Payer: Self-pay

## 2022-08-18 ENCOUNTER — Encounter: Payer: Self-pay | Admitting: Orthopedic Surgery

## 2022-08-18 LAB — PSA: PSA: 1.38 ng/mL (ref <=4.00)

## 2022-08-18 LAB — CBC WITH DIFFERENTIAL/PLATELET
Absolute Monocytes: 270 cells/uL (ref 200–950)
Basophils Absolute: 50 cells/uL (ref 0–200)
Basophils Relative: 1.1 %
Eosinophils Absolute: 140 cells/uL (ref 15–500)
Eosinophils Relative: 3.1 %
HCT: 50.3 % — ABNORMAL HIGH (ref 38.5–50.0)
Hemoglobin: 17.1 g/dL (ref 13.2–17.1)
Lymphs Abs: 1161 cells/uL (ref 850–3900)
MCH: 31.8 pg (ref 27.0–33.0)
MCHC: 34 g/dL (ref 32.0–36.0)
MCV: 93.5 fL (ref 80.0–100.0)
MPV: 9.4 fL (ref 7.5–12.5)
Monocytes Relative: 6 %
Neutro Abs: 2880 cells/uL (ref 1500–7800)
Neutrophils Relative %: 64 %
Platelets: 226 10*3/uL (ref 140–400)
RBC: 5.38 10*6/uL (ref 4.20–5.80)
RDW: 12.3 % (ref 11.0–15.0)
Total Lymphocyte: 25.8 %
WBC: 4.5 10*3/uL (ref 3.8–10.8)

## 2022-08-18 LAB — COMPLETE METABOLIC PANEL WITH GFR
AG Ratio: 2.6 (calc) — ABNORMAL HIGH (ref 1.0–2.5)
ALT: 20 U/L (ref 9–46)
AST: 22 U/L (ref 10–35)
Albumin: 4.7 g/dL (ref 3.6–5.1)
Alkaline phosphatase (APISO): 101 U/L (ref 35–144)
BUN: 22 mg/dL (ref 7–25)
CO2: 28 mmol/L (ref 20–32)
Calcium: 10.4 mg/dL — ABNORMAL HIGH (ref 8.6–10.3)
Chloride: 104 mmol/L (ref 98–110)
Creat: 1 mg/dL (ref 0.70–1.28)
Globulin: 1.8 g/dL (calc) — ABNORMAL LOW (ref 1.9–3.7)
Glucose, Bld: 89 mg/dL (ref 65–99)
Potassium: 4.2 mmol/L (ref 3.5–5.3)
Sodium: 139 mmol/L (ref 135–146)
Total Bilirubin: 1.3 mg/dL — ABNORMAL HIGH (ref 0.2–1.2)
Total Protein: 6.5 g/dL (ref 6.1–8.1)
eGFR: 79 mL/min/{1.73_m2} (ref >=60)

## 2022-08-18 LAB — LIPID PANEL
Cholesterol: 210 mg/dL — ABNORMAL HIGH (ref <200)
HDL: 80 mg/dL (ref >=40)
LDL Cholesterol (Calc): 110 mg/dL (calc) — ABNORMAL HIGH
Non-HDL Cholesterol (Calc): 130 mg/dL (calc) — ABNORMAL HIGH (ref <130)
Total CHOL/HDL Ratio: 2.6 (calc) (ref <5.0)
Triglycerides: 92 mg/dL (ref <150)

## 2022-10-10 ENCOUNTER — Other Ambulatory Visit: Payer: Self-pay | Admitting: Orthopedic Surgery

## 2022-10-10 DIAGNOSIS — E559 Vitamin D deficiency, unspecified: Secondary | ICD-10-CM

## 2022-11-08 DIAGNOSIS — N200 Calculus of kidney: Secondary | ICD-10-CM | POA: Diagnosis not present

## 2022-11-08 DIAGNOSIS — N401 Enlarged prostate with lower urinary tract symptoms: Secondary | ICD-10-CM | POA: Diagnosis not present

## 2022-11-08 DIAGNOSIS — R351 Nocturia: Secondary | ICD-10-CM | POA: Diagnosis not present

## 2022-11-24 ENCOUNTER — Telehealth: Payer: Medicare Other

## 2022-11-24 ENCOUNTER — Other Ambulatory Visit: Payer: Self-pay | Admitting: Orthopedic Surgery

## 2022-11-24 DIAGNOSIS — U071 COVID-19: Secondary | ICD-10-CM

## 2022-11-24 MED ORDER — NIRMATRELVIR/RITONAVIR (PAXLOVID)TABLET: 3.0000 | ORAL_TABLET | Freq: Two times a day (BID) | ORAL | 0 refills | Status: AC

## 2022-11-24 NOTE — Telephone Encounter (Signed)
I spoke with patient and informed him that prescription has been sent to pharmacy and other recommendations from Hazle Nordmann, NP. He verbalized his understanding and agreed

## 2022-11-24 NOTE — Telephone Encounter (Signed)
Patient call this afternoon because his wife positive for Covid and he said that he has the same symptoms from Covid and he is asking Octavia Heir, NP for a prescription call Paxlovid that treat Covid to the [facility] [address], [place], [place] [address].   Message sent to Octavia Heir, NP

## 2022-11-24 NOTE — Telephone Encounter (Signed)
Prescription for [facility] sent to [facility] in [place]. Paxlovid can be started within 72 hours of initial symptoms. This medication can cause rebound symptoms after a week of taking. You must stop red yeast rice while on Paxlovid. Encourage rest and fluids. Recommend vitamin C 1000 mg daily x 7 days, vitamin D 2000 units daily x 7 days and Zinc 50 mg daily x 7 days. If symptoms worsen please let us know. Feel better.

## 2023-02-08 DIAGNOSIS — D2371 Other benign neoplasm of skin of right lower limb, including hip: Secondary | ICD-10-CM | POA: Diagnosis not present

## 2023-02-08 DIAGNOSIS — S90861A Insect bite (nonvenomous), right foot, initial encounter: Secondary | ICD-10-CM | POA: Diagnosis not present

## 2023-02-08 DIAGNOSIS — L814 Other melanin hyperpigmentation: Secondary | ICD-10-CM | POA: Diagnosis not present

## 2023-02-08 DIAGNOSIS — L821 Other seborrheic keratosis: Secondary | ICD-10-CM | POA: Diagnosis not present

## 2023-02-08 DIAGNOSIS — D2261 Melanocytic nevi of right upper limb, including shoulder: Secondary | ICD-10-CM | POA: Diagnosis not present

## 2023-02-08 DIAGNOSIS — D1801 Hemangioma of skin and subcutaneous tissue: Secondary | ICD-10-CM | POA: Diagnosis not present

## 2023-02-22 ENCOUNTER — Encounter: Payer: Self-pay | Admitting: Orthopedic Surgery

## 2023-02-22 ENCOUNTER — Ambulatory Visit (INDEPENDENT_AMBULATORY_CARE_PROVIDER_SITE_OTHER): Payer: Medicare Other | Admitting: Orthopedic Surgery

## 2023-02-22 VITALS — BP 142/80 | HR 65 | Temp 97.7°F | Resp 16 | Ht 66.5 in | Wt 167.6 lb

## 2023-02-22 DIAGNOSIS — I10 Essential (primary) hypertension: Secondary | ICD-10-CM

## 2023-02-22 DIAGNOSIS — E7800 Pure hypercholesterolemia, unspecified: Secondary | ICD-10-CM

## 2023-02-22 DIAGNOSIS — Z23 Encounter for immunization: Secondary | ICD-10-CM | POA: Diagnosis not present

## 2023-02-22 DIAGNOSIS — M85859 Other specified disorders of bone density and structure, unspecified thigh: Secondary | ICD-10-CM

## 2023-02-22 DIAGNOSIS — R001 Bradycardia, unspecified: Secondary | ICD-10-CM | POA: Diagnosis not present

## 2023-02-22 DIAGNOSIS — E559 Vitamin D deficiency, unspecified: Secondary | ICD-10-CM

## 2023-02-22 DIAGNOSIS — N400 Enlarged prostate without lower urinary tract symptoms: Secondary | ICD-10-CM | POA: Diagnosis not present

## 2023-02-22 DIAGNOSIS — G4733 Obstructive sleep apnea (adult) (pediatric): Secondary | ICD-10-CM | POA: Diagnosis not present

## 2023-02-22 LAB — CBC WITH DIFFERENTIAL/PLATELET
Absolute Monocytes: 316 cells/uL (ref 200–950)
Basophils Absolute: 49 cells/uL (ref 0–200)
Basophils Relative: 1.2 %
Eosinophils Absolute: 90 cells/uL (ref 15–500)
Eosinophils Relative: 2.2 %
HCT: 49 % (ref 38.5–50.0)
Hemoglobin: 16.6 g/dL (ref 13.2–17.1)
Lymphs Abs: 1099 cells/uL (ref 850–3900)
MCH: 31.9 pg (ref 27.0–33.0)
MCHC: 33.9 g/dL (ref 32.0–36.0)
MCV: 94.2 fL (ref 80.0–100.0)
MPV: 9.9 fL (ref 7.5–12.5)
Monocytes Relative: 7.7 %
Neutro Abs: 2546 cells/uL (ref 1500–7800)
Neutrophils Relative %: 62.1 %
Platelets: 225 10*3/uL (ref 140–400)
RBC: 5.2 10*6/uL (ref 4.20–5.80)
RDW: 12.3 % (ref 11.0–15.0)
Total Lymphocyte: 26.8 %
WBC: 4.1 10*3/uL (ref 3.8–10.8)

## 2023-02-22 MED ORDER — AMLODIPINE BESYLATE 5 MG PO TABS
5.0000 mg | ORAL_TABLET | Freq: Every day | ORAL | 3 refills | Status: DC
Start: 2023-02-22 — End: 2023-06-18

## 2023-02-22 NOTE — Progress Notes (Signed)
Careteam: Patient Care Team: Octavia Heir, NP as PCP - General (Adult Health Nurse Practitioner) Bjorn Pippin, MD as Attending Physician (Urology) Bufford Buttner, MD as Referring Physician (Dermatology)  Seen by: Hazle Nordmann, AGNP-C  PLACE OF SERVICE:  [facility]  Advanced Directive information    No Known Allergies  Chief Complaint  Patient presents with   Medical Management of Chronic Issues    6 month follow up.    Immunizations    Discuss the need for Covid Booster, and Influenza vaccine. [facility] Verified.      HPI: Patient is a 72 y.o. male seen today for medical management of chronic conditions.   Fasting for lab work.   No health concerns.   EKG reveals sinus bradycardia. Denies chest pain, dizziness, sob.   Rechecked bp improved, reports " white coat syndrome." Taking amlodipine as prescribed.   Weights stable.   Trying vegetarian diet. LDL was 110. Asking to have lipid panel today.   Flu vaccine given.         Review of Systems:  Review of Systems  Constitutional: Negative.   HENT: Negative.    Eyes: Negative.   Respiratory: Negative.    Cardiovascular: Negative.   Gastrointestinal: Negative.   Genitourinary: Negative.   Musculoskeletal:  Positive for joint pain.  Skin: Negative.   Neurological: Negative.   Psychiatric/Behavioral: Negative.      Past Medical History:  Diagnosis Date   Family history of breast cancer    Family history of genetic disease carrier 04/02/2017   Daughter has a CHEK2 c.1100delC pathogenic variant.    Family history of melanoma    Family history of non-Hodgkin's lymphoma    Family hx of melanoma    History of bone density study 2017   Hx of adenomatous colonic polyps    and hyperplastic   Hypertension    Kidney stones    Osteopenia    Peptic ulcer disease    Sleep apnea    CPAP   Vitamin D deficiency    Past Surgical History:  Procedure Laterality Date   COLONOSCOPY  2009,2015   MOUTH SURGERY   2014   TONSILLECTOMY AND ADENOIDECTOMY  1972   VASECTOMY  1981   Social History:   reports that he quit smoking about 52 years ago. His smoking use included cigarettes. He has never used smokeless tobacco. He reports current alcohol use. He reports that he does not use drugs.  Family History  Problem Relation Age of Onset   Broken bones Father    Melanoma Brother 46   Non-Hodgkin's lymphoma Brother 70   CAD Brother    Breast cancer Daughter 13       CHEK2+   Heart attack Paternal Uncle 43   Parkinson's disease Maternal Grandmother    Cancer Cousin 16       type unk    Medications: Patient's Medications  New Prescriptions   No medications on file  Previous Medications   AMLODIPINE (NORVASC) 5 MG TABLET    TAKE 1 TABLET (5 MG TOTAL) BY MOUTH DAILY.   B COMPLEX-C (B COMPLEX-VITAMIN C PO)    Take 500 mg by mouth daily.   CALCIUM CARBONATE ANTACID (CALCIUM CARBONATE PO)    Take by mouth 4 (four) times a week.   COENZYME Q10 (CO Q-10) 100 MG CAPS    Take 1 capsule by mouth daily.   DURAPATITE, HYDROXYAPATITE, (CALCIUM HYDROXYAPATITE) POWD    Take 500 mg by mouth daily.  FLUTICASONE PROPIONATE (FLONASE NA)    Place into the nose as needed.   GINKGO BILOBA 100 MG CAPS    Take 1 capsule by mouth daily.   GLUCOSAMINE-CHONDROITIN PO    Take 1,500 mg by mouth daily.   LORATADINE (CLARITIN PO)    Take by mouth as needed.   MULTIPLE VITAMIN (MULTIVITAMIN) TABLET    Take 1 tablet by mouth daily.   OMEGA-3 FATTY ACIDS (OMEGA 3 PO)    Take 1 tablet by mouth daily.   OVER THE COUNTER MEDICATION    Super beta prostate 1 capsule twice daily   RED YEAST RICE EXTRACT (RED YEAST RICE PO)    Take 1,200 mg by mouth daily at 12 noon.   SODIUM HYALURONATE, ORAL, (HYALURONIC ACID) 100 MG CAPS    Take 1 capsule by mouth in the morning and at bedtime.   TURMERIC PO    Take 800 mg by mouth daily.   VITAMIN D, ERGOCALCIFEROL, (DRISDOL) 1.25 MG (50000 UNIT) CAPS CAPSULE    TAKE 1 CAPSULE EVERY 14  (FOURTEEN) DAYS.  Modified Medications   No medications on file  Discontinued Medications   No medications on file    Physical Exam:  There were no vitals filed for this visit. There is no height or weight on file to calculate BMI. Wt Readings from Last 3 Encounters:  08/17/22 165 lb 6.4 oz (75 kg)  07/06/22 168 lb 6.4 oz (76.4 kg)  05/18/22 167 lb 8 oz (76 kg)    Physical Exam Vitals reviewed.  Constitutional:      General: He is not in acute distress. HENT:     Head: Normocephalic.  Eyes:     General:        Right eye: No discharge.        Left eye: No discharge.  Neck:     Vascular: No carotid bruit.  Cardiovascular:     Rate and Rhythm: Regular rhythm. Bradycardia present.     Heart sounds: Normal heart sounds.  Pulmonary:     Effort: Pulmonary effort is normal.     Breath sounds: Normal breath sounds.  Abdominal:     General: Bowel sounds are normal.     Palpations: Abdomen is soft.  Musculoskeletal:     Cervical back: Neck supple.     Right lower leg: No edema.     Left lower leg: No edema.  Skin:    General: Skin is warm.     Capillary Refill: Capillary refill takes less than 2 seconds.  Neurological:     General: No focal deficit present.     Mental Status: He is alert and oriented to person, place, and time.  Psychiatric:        Mood and Affect: Mood normal.     Labs reviewed: Basic Metabolic Panel: Recent Labs    08/17/22 0858  NA 139  K 4.2  CL 104  CO2 28  GLUCOSE 89  BUN 22  CREATININE 1.00  CALCIUM 10.4*   Liver Function Tests: Recent Labs    08/17/22 0858  AST 22  ALT 20  BILITOT 1.3*  PROT 6.5   No results for input(s): "LIPASE", "AMYLASE" in the last 8760 hours. No results for input(s): "AMMONIA" in the last 8760 hours. CBC: Recent Labs    08/17/22 0858  WBC 4.5  NEUTROABS 2,880  HGB 17.1  HCT 50.3*  MCV 93.5  PLT 226   Lipid Panel: Recent Labs    08/17/22  0858  CHOL 210*  HDL 80  LDLCALC 110*  TRIG 92   CHOLHDL 2.6   TSH: No results for input(s): "TSH" in the last 8760 hours. A1C: No results found for: "HGBA1C"   Assessment/Plan 1. Primary hypertension - controlled, goal < 150/90 - " white coat syndrome"  - cont amlodipine - CBC with Differential/Platelet - Basic Metabolic Panel with eGFR - EKG 12-Lead - amLODipine (NORVASC) 5 MG tablet; Take 1 tablet (5 mg total) by mouth daily.  Dispense: 90 tablet; Refill: 3  2. Obstructive sleep apnea - cont CPAP  3. Benign prostatic hyperplasia without lower urinary tract symptoms - no retention/frequency - cont super beta prostate supplement  4. Vitamin D deficiency - Vitamin D, 25-hydroxy  5. Osteopenia of neck of femur, unspecified laterality - noted DEXA 2023 - cont calcium and vitamin D   6. Need for influenza vaccination - Flu Vaccine Trivalent High Dose (Fluad)  7. Pure hypercholesterolemia - Total 210, LDL 110 - now vegetarian diet - on red yeast rice - Lipid Panel  8. Sinus bradycardia  -HR 57 on EKG - asymptomatic  Total time: 34 minutes. Greater than 50% of total time spent doing patient education regarding health maintenance, HTN, HLD, OSA, and BPH including symptom/medication management.    Next appt: Visit date not found  Carlotta Telfair Scherry Ran  [facility] [phone]

## 2023-02-23 LAB — BASIC METABOLIC PANEL WITH GFR
BUN: 21 mg/dL (ref 7–25)
CO2: 27 mmol/L (ref 20–32)
Calcium: 10.2 mg/dL (ref 8.6–10.3)
Chloride: 105 mmol/L (ref 98–110)
Creat: 1.07 mg/dL (ref 0.70–1.28)
Glucose, Bld: 88 mg/dL (ref 65–99)
Potassium: 4.2 mmol/L (ref 3.5–5.3)
Sodium: 139 mmol/L (ref 135–146)
eGFR: 73 mL/min/{1.73_m2} (ref >=60)

## 2023-02-23 LAB — LIPID PANEL
Cholesterol: 185 mg/dL (ref <200)
HDL: 74 mg/dL (ref >=40)
LDL Cholesterol (Calc): 92 mg/dL
Non-HDL Cholesterol (Calc): 111 mg/dL (ref <130)
Total CHOL/HDL Ratio: 2.5 (calc) (ref <5.0)
Triglycerides: 97 mg/dL (ref <150)

## 2023-02-23 LAB — VITAMIN D 25 HYDROXY (VIT D DEFICIENCY, FRACTURES): Vit D, 25-Hydroxy: 65 ng/mL (ref 30–100)

## 2023-03-15 ENCOUNTER — Encounter: Payer: Self-pay | Admitting: Orthopedic Surgery

## 2023-04-10 DIAGNOSIS — H52203 Unspecified astigmatism, bilateral: Secondary | ICD-10-CM | POA: Diagnosis not present

## 2023-04-10 DIAGNOSIS — H2513 Age-related nuclear cataract, bilateral: Secondary | ICD-10-CM | POA: Diagnosis not present

## 2023-04-10 DIAGNOSIS — H35371 Puckering of macula, right eye: Secondary | ICD-10-CM | POA: Diagnosis not present

## 2023-06-08 ENCOUNTER — Telehealth: Payer: Self-pay

## 2023-06-08 NOTE — Telephone Encounter (Signed)
 Patient states he has had bradycardia and ws told nothing should be done unless he has other symptoms, and now both hands seems to be very cold all the time. Patient would like to know is there something else that needs to be done at the moment.

## 2023-06-08 NOTE — Telephone Encounter (Signed)
 Bradycardia is concerning when there is fatigue, dizziness, shortness of breath, chest pain or fainting occur. This can indicate underlying heart condition. He can schedule appointment at our office to have EKG done. He is also followed by cardiology, Dr. Jeffrie, and he can notify them.

## 2023-06-16 ENCOUNTER — Other Ambulatory Visit: Payer: Self-pay | Admitting: Orthopedic Surgery

## 2023-06-16 DIAGNOSIS — I10 Essential (primary) hypertension: Secondary | ICD-10-CM

## 2023-07-03 ENCOUNTER — Ambulatory Visit (HOSPITAL_BASED_OUTPATIENT_CLINIC_OR_DEPARTMENT_OTHER): Payer: Medicare Other | Admitting: Cardiology

## 2023-08-23 ENCOUNTER — Ambulatory Visit (INDEPENDENT_AMBULATORY_CARE_PROVIDER_SITE_OTHER): Payer: Medicare Other | Admitting: Orthopedic Surgery

## 2023-08-23 ENCOUNTER — Encounter: Payer: Self-pay | Admitting: Orthopedic Surgery

## 2023-08-23 VITALS — BP 148/84 | HR 56 | Temp 96.6°F | Resp 16 | Ht 66.5 in | Wt 167.6 lb

## 2023-08-23 DIAGNOSIS — N400 Enlarged prostate without lower urinary tract symptoms: Secondary | ICD-10-CM | POA: Diagnosis not present

## 2023-08-23 DIAGNOSIS — E7800 Pure hypercholesterolemia, unspecified: Secondary | ICD-10-CM | POA: Diagnosis not present

## 2023-08-23 DIAGNOSIS — M85859 Other specified disorders of bone density and structure, unspecified thigh: Secondary | ICD-10-CM

## 2023-08-23 DIAGNOSIS — I10 Essential (primary) hypertension: Secondary | ICD-10-CM | POA: Diagnosis not present

## 2023-08-23 DIAGNOSIS — R001 Bradycardia, unspecified: Secondary | ICD-10-CM

## 2023-08-23 MED ORDER — AMLODIPINE BESYLATE 5 MG PO TABS
7.5000 mg | ORAL_TABLET | Freq: Every day | ORAL | 3 refills | Status: DC
Start: 2023-08-23 — End: 2024-01-15

## 2023-08-23 NOTE — Progress Notes (Signed)
 Careteam: Patient Care Team: Ryan Heir, NP as PCP - General (Adult Health Nurse Practitioner) Ryan Pippin, MD as Attending Physician (Urology) Ryan Buttner, MD as Referring Physician (Dermatology)  Seen by: Ryan Ryan [name], AGNP-C  PLACE OF SERVICE:  [facility]  Advanced Directive information Does Patient Have a Medical Advance Directive?: Yes, Type of Advance Directive: Healthcare Power of [place];Living will;Out of facility DNR (pink MOST or yellow form), Does patient want to make changes to medical advance directive?: No - Patient declined  No Known Allergies  Chief Complaint  Patient presents with   Medical Management of Chronic Issues    6 month follow up. Discuss the need for Covid Booster, and AWV.     HPI: Patient is a 73 y.o. Ryan [name] seen today for medical management of chronic conditions.   Discussed the use of AI scribe software for clinical note transcription with the patient, who gave verbal consent to proceed.  History of Present Illness    Ryan Ryan [name] is a 73 year old Ryan [name] who presents with heart rhythm Ryan [name].  Bradycardia noted last encounter. HR was in 50's. EKG confirmed sinus bradycardia. Today he reports a single episode of heart palpitations at night, noted by his Fitbit as an irregular heartbeat. He was scheduled to see Ryan Ryan [name] 06/2023 but cancelled appointment because he felt fine. No chest pain, shortness of breath, or difficulty breathing with exercise.  Home blood pressures averaging SBP 140's or more. He takes amlodipine 5 mg once daily. He experiences 'white coat' hypertension, with elevated readings in the doctor's office. He checks his blood pressure in the morning after sitting for five minutes and sometimes in the evening. Rechecked blood pressure 148/84.   He has a history of elevated cholesterol, managed with red yeast rice, which he takes with a meal. His last cholesterol check showed a total cholesterol of 185 mg/dL and LDL of  92 mg/dL.   Last DEXA 11/2021> osteopenia. Remains on calcium and vitamin D supplement.   He experiences tightness in his legs at night, described as 'tight' rather than cramping. He alternates between using Advil and Voltaren for relief, though he is advised against regular Advil use due to his blood pressure medication. He has tried Tylenol but finds it less effective.  Followed by urology, Dr. Wilson Ryan [name]. He has a history of kidney stones and occasional blood in urine, evaluated with CT scans suggesting possible kidney stone passage. Also BPH. Asymptomatic at this time. Remains on super beta prostate supplement.      Review of Systems:  Review of Systems  Constitutional: Negative.   HENT: Negative.    Eyes: Negative.   Respiratory:  Negative for cough, shortness of breath and wheezing.   Cardiovascular:  Positive for palpitations. Negative for chest pain and leg swelling.  Gastrointestinal: Negative.   Genitourinary: Negative.   Musculoskeletal:  Positive for myalgias.  Skin: Negative.   Neurological: Negative.   Endo/Heme/Allergies:  Positive for environmental allergies.  Psychiatric/Behavioral: Negative.      Past Medical History:  Diagnosis Date   Family history of breast cancer    Family history of genetic disease carrier 04/02/2017   Daughter has a CHEK2 c.1100delC pathogenic variant.    Family history of melanoma    Family history of non-Hodgkin's lymphoma    Family hx of melanoma    History of bone density study 2017   Hx of adenomatous colonic polyps    and hyperplastic   Hypertension    Kidney stones  Osteopenia    Peptic ulcer disease    Sleep apnea    CPAP   Vitamin D deficiency    Past Surgical History:  Procedure Laterality Date   COLONOSCOPY  2009,2015   MOUTH SURGERY  2014   TONSILLECTOMY AND ADENOIDECTOMY  1972   VASECTOMY  1981   Social History:   reports that he quit smoking about 53 years ago. His smoking use included cigarettes. He has never used  smokeless tobacco. He reports current alcohol use. He reports that he does not use drugs.  Family History  Problem Relation Age of Onset   Broken bones Father    Melanoma Brother 17   Non-Hodgkin's lymphoma Brother 19   CAD Brother    Breast cancer Daughter 26       CHEK2+   Heart attack Paternal Uncle 42   Parkinson's disease Maternal Grandmother    Cancer Cousin 79       type unk    Medications: Patient's Medications  New Prescriptions   No medications on file  Previous Medications   AMLODIPINE (NORVASC) 5 MG TABLET    TAKE 1 TABLET EVERY DAY   B COMPLEX-C (B COMPLEX-VITAMIN C PO)    Take 500 mg by mouth daily.   CALCIUM CARBONATE ANTACID (CALCIUM CARBONATE PO)    Take by mouth 4 (four) times a week.   COENZYME Q10 (CO Q-10) 100 MG CAPS    Take 1 capsule by mouth daily.   DURAPATITE, HYDROXYAPATITE, (CALCIUM HYDROXYAPATITE) POWD    Take 500 mg by mouth daily.   FLUTICASONE PROPIONATE (FLONASE NA)    Place into the nose as needed.   GINKGO BILOBA 100 MG CAPS    Take 1 capsule by mouth daily.   GLUCOSAMINE-CHONDROITIN PO    Take 1,500 mg by mouth daily.   LORATADINE (CLARITIN PO)    Take by mouth as needed.   MULTIPLE VITAMIN (MULTIVITAMIN) TABLET    Take 1 tablet by mouth daily.   OMEGA-3 FATTY ACIDS (OMEGA 3 PO)    Take 1 tablet by mouth daily.   OVER THE COUNTER MEDICATION    Super beta prostate 1 capsule twice daily   RED YEAST RICE EXTRACT (RED YEAST RICE PO)    Take 1,200 mg by mouth daily at 12 noon.   SODIUM HYALURONATE, ORAL, (HYALURONIC ACID) 100 MG CAPS    Take 1 capsule by mouth in the morning and at bedtime.   TURMERIC PO    Take 800 mg by mouth daily.   VITAMIN D, ERGOCALCIFEROL, (DRISDOL) 1.25 MG (50000 UNIT) CAPS CAPSULE    TAKE 1 CAPSULE EVERY 14 (FOURTEEN) DAYS.  Modified Medications   No medications on file  Discontinued Medications   No medications on file    Physical Exam:  Vitals:   08/23/23 0821  BP: (!) 146/68  Pulse: (!) 56  Resp: 16  Temp:  (!) 96.6 F (35.9 C)  SpO2: 94%  Weight: 167 lb 9.6 oz (76 kg)  Height: 5' 6.5" (1.689 m)   Body mass index is 26.65 kg/m. Wt Readings from Last 3 Encounters:  08/23/23 167 lb 9.6 oz (76 kg)  02/22/23 167 lb 9.6 oz (76 kg)  08/17/22 165 lb 6.4 oz (75 kg)    Physical Exam Vitals reviewed.  Constitutional:      General: He is not in acute distress. HENT:     Head: Normocephalic.  Eyes:     General:        Right  eye: No discharge.        Left eye: No discharge.  Cardiovascular:     Rate and Rhythm: Regular rhythm. Bradycardia present.     Pulses: Normal pulses.     Heart sounds: Normal heart sounds.  Pulmonary:     Effort: Pulmonary effort is normal.     Breath sounds: Normal breath sounds.  Abdominal:     General: Bowel sounds are normal.     Palpations: Abdomen is soft.  Musculoskeletal:     Cervical back: Neck supple.     Right lower leg: No edema.     Left lower leg: No edema.  Skin:    General: Skin is warm.     Capillary Refill: Capillary refill takes less than 2 seconds.  Neurological:     General: No focal deficit present.     Mental Status: He is alert and oriented to person, place, and time.  Psychiatric:        Mood and Affect: Mood normal.     Labs reviewed: Basic Metabolic Panel: Recent Labs    02/22/23 0902  NA 139  K 4.2  CL 105  CO2 27  GLUCOSE 88  BUN 21  CREATININE 1.07  CALCIUM 10.2   Liver Function Tests: No results for input(s): "AST", "ALT", "ALKPHOS", "BILITOT", "PROT", "ALBUMIN" in the last 8760 hours. No results for input(s): "LIPASE", "AMYLASE" in the last 8760 hours. No results for input(s): "AMMONIA" in the last 8760 hours. CBC: Recent Labs    02/22/23 0902  WBC 4.1  NEUTROABS 2,546  HGB 16.6  HCT 49.0  MCV 94.2  PLT 225   Lipid Panel: Recent Labs    02/22/23 0902  CHOL 185  HDL 74  LDLCALC 92  TRIG 97  CHOLHDL 2.5   TSH: No results for input(s): "TSH" in the last 8760 hours. A1C: No results found for:  "HGBA1C"   Assessment/Plan 1. Primary hypertension (Primary) - mildly elevated - BUN/creat 21/1.07 02/22/2023 - will increase amlodipine to 7.5 mg daily  - Complete Metabolic Panel with eGFR - amLODipine (NORVASC) 5 MG tablet; Take 1.5 tablets (7.5 mg total) by mouth daily.  Dispense: 120 tablet; Refill: 3  2. Pure hypercholesterolemia - Total 185, LDL 92 01/2023 - cont red yeast rice - cont diet low in processed and fried foods - Lipid Panel  3. Bradycardia - HR 56 today - asymptomatic - cancelled cardiology appointment 06/2023 - 01/2023 EKG sinus bradycardia - recommend cardiology evaluation if symptoms occur  4. Benign prostatic hyperplasia without lower urinary tract symptoms - followed by Dr. Wilson Ryan [name] with [facility] - no issues with urinating at this time - cont super beta prostate - PSA  5. Osteopenia of neck of femur, unspecified laterality - DEXA 11/2021> osteopenia - cont calcium and vitamin D supplement   Total time: 32 minutes. Greater than 50% of total time spent doing patient education regarding health maintenance, HTN, bradycardia, cholesterol and osteopenia including symptom/medication management.     Next appt: Dacey Milberger Norval Gable, NP  Nikoleta Dady Scherry Ran  [facility] [phone]

## 2023-08-23 NOTE — Patient Instructions (Addendum)
 Dr. Epifanio Lesches [phone]  Recommend trying compression stockings  Avoid using NSAIDS while on blood pressure medication  Voltaren gel is fine to use  Try tylenol 1000 mg at night for leg cramping   Amlodipine increased to 7.5 mg> that 1.5 tablets every day  Please take blood pressures twice daily x 10 days and let me know if blood pressures are in 140's-150's

## 2023-08-24 ENCOUNTER — Encounter: Payer: Self-pay | Admitting: Orthopedic Surgery

## 2023-08-24 LAB — COMPLETE METABOLIC PANEL WITHOUT GFR
AG Ratio: 2.5 (calc) (ref 1.0–2.5)
ALT: 29 U/L (ref 9–46)
AST: 23 U/L (ref 10–35)
Albumin: 4.7 g/dL (ref 3.6–5.1)
Alkaline phosphatase (APISO): 110 U/L (ref 35–144)
BUN: 23 mg/dL (ref 7–25)
CO2: 28 mmol/L (ref 20–32)
Calcium: 10.3 mg/dL (ref 8.6–10.3)
Chloride: 105 mmol/L (ref 98–110)
Creat: 0.92 mg/dL (ref 0.70–1.28)
Globulin: 1.9 g/dL (ref 1.9–3.7)
Glucose, Bld: 88 mg/dL (ref 65–99)
Potassium: 4.4 mmol/L (ref 3.5–5.3)
Sodium: 140 mmol/L (ref 135–146)
Total Bilirubin: 1.4 mg/dL — ABNORMAL HIGH (ref 0.2–1.2)
Total Protein: 6.6 g/dL (ref 6.1–8.1)

## 2023-08-24 LAB — LIPID PANEL
Cholesterol: 216 mg/dL — ABNORMAL HIGH (ref <200)
HDL: 79 mg/dL (ref >=40)
LDL Cholesterol (Calc): 116 mg/dL — ABNORMAL HIGH
Non-HDL Cholesterol (Calc): 137 mg/dL — ABNORMAL HIGH (ref <130)
Total CHOL/HDL Ratio: 2.7 (calc) (ref <5.0)
Triglycerides: 107 mg/dL (ref <150)

## 2023-08-24 LAB — PSA: PSA: 1.92 ng/mL (ref <=4.00)

## 2023-09-02 ENCOUNTER — Other Ambulatory Visit: Payer: Self-pay | Admitting: Orthopedic Surgery

## 2023-09-02 DIAGNOSIS — E559 Vitamin D deficiency, unspecified: Secondary | ICD-10-CM

## 2023-11-02 DIAGNOSIS — H1032 Unspecified acute conjunctivitis, left eye: Secondary | ICD-10-CM | POA: Diagnosis not present

## 2023-11-07 DIAGNOSIS — R351 Nocturia: Secondary | ICD-10-CM | POA: Diagnosis not present

## 2023-11-07 DIAGNOSIS — R3121 Asymptomatic microscopic hematuria: Secondary | ICD-10-CM | POA: Diagnosis not present

## 2023-11-07 DIAGNOSIS — N200 Calculus of kidney: Secondary | ICD-10-CM | POA: Diagnosis not present

## 2023-11-07 DIAGNOSIS — N401 Enlarged prostate with lower urinary tract symptoms: Secondary | ICD-10-CM | POA: Diagnosis not present

## 2023-11-07 DIAGNOSIS — R350 Frequency of micturition: Secondary | ICD-10-CM | POA: Diagnosis not present

## 2023-11-08 ENCOUNTER — Other Ambulatory Visit: Payer: Self-pay | Admitting: Urology

## 2023-11-08 ENCOUNTER — Encounter (HOSPITAL_COMMUNITY): Payer: Self-pay | Admitting: Urology

## 2023-11-08 NOTE — Progress Notes (Addendum)
 Spoke w/ via phone for pre-op interview---Pt Lab needs dos----KUB         Lab results------ COVID test ---Not Indicated--patient states asymptomatic no test needed Arrive at ------0800- NPO after MN  Pre-Surgery Ensure or G2:  Med rec completed Medications to take morning of surgery ---Norvasc , Flonase, prn, Colace, prn, Claritin-- Stop all vitamins, supplements, NSAIDS, pepto bismol by Tuesday night and no aspirin starting Tuesday morning Diabetic medication -----  GLP1 agonist last dose: GLP1 instructions:  Patient instructed no nail polish to be worn day of surgery Patient instructed to bring photo id and insurance card day of surgery Patient aware to have Driver (ride ) / caregiver    for 24 hours after surgery - spouse- Archie Bearded Patient Special Instructions -----Laxative on Thursday, hydrate well and eat light dinner, bring driver's license and ins card Pre-Op special Instructions -----per [place] [facility]  Patient verbalized understanding of instructions that were given at this phone interview. Patient denies chest pain, sob, fever, cough at the interview.

## 2023-11-16 ENCOUNTER — Ambulatory Visit (HOSPITAL_COMMUNITY)
Admission: RE | Admit: 2023-11-16 | Discharge: 2023-11-16 | Disposition: A | Discharge status: Home / Self Care | Attending: Urology | Admitting: Urology

## 2023-11-16 ENCOUNTER — Ambulatory Visit (HOSPITAL_COMMUNITY)

## 2023-11-16 ENCOUNTER — Other Ambulatory Visit: Payer: Self-pay

## 2023-11-16 ENCOUNTER — Encounter (HOSPITAL_COMMUNITY): Payer: Self-pay | Admitting: Urology

## 2023-11-16 ENCOUNTER — Encounter (HOSPITAL_COMMUNITY)
Admission: RE | Disposition: A | Discharge status: Home / Self Care | Payer: Self-pay | Source: Home / Self Care | Attending: Urology

## 2023-11-16 DIAGNOSIS — N401 Enlarged prostate with lower urinary tract symptoms: Secondary | ICD-10-CM | POA: Insufficient documentation

## 2023-11-16 DIAGNOSIS — R350 Frequency of micturition: Secondary | ICD-10-CM | POA: Diagnosis not present

## 2023-11-16 DIAGNOSIS — I10 Essential (primary) hypertension: Secondary | ICD-10-CM | POA: Insufficient documentation

## 2023-11-16 DIAGNOSIS — G4730 Sleep apnea, unspecified: Secondary | ICD-10-CM | POA: Insufficient documentation

## 2023-11-16 DIAGNOSIS — R351 Nocturia: Secondary | ICD-10-CM | POA: Insufficient documentation

## 2023-11-16 DIAGNOSIS — N200 Calculus of kidney: Secondary | ICD-10-CM | POA: Diagnosis not present

## 2023-11-16 HISTORY — PX: EXTRACORPOREAL SHOCK WAVE LITHOTRIPSY: SHX1557

## 2023-11-16 HISTORY — DX: Bradycardia, unspecified: R00.1

## 2023-11-16 SURGERY — LITHOTRIPSY, ESWL
Anesthesia: LOCAL | Laterality: Right

## 2023-11-16 MED ORDER — DIPHENHYDRAMINE HCL 25 MG PO CAPS
25.0000 mg | ORAL_CAPSULE | ORAL | Status: AC
  Administered 2023-11-16: 25 mg via ORAL
  Filled 2023-11-16: qty 1

## 2023-11-16 MED ORDER — OXYCODONE-ACETAMINOPHEN 5-325 MG PO TABS
ORAL_TABLET | ORAL | Status: AC
  Administered 2023-11-16: 1
  Filled 2023-11-16: qty 1

## 2023-11-16 MED ORDER — SODIUM CHLORIDE 0.9 % IV SOLN: INTRAVENOUS | Status: DC

## 2023-11-16 MED ORDER — OXYCODONE-ACETAMINOPHEN 5-325 MG PO TABS: 1.0000 | ORAL_TABLET | Freq: Once | ORAL | Status: DC

## 2023-11-16 MED ORDER — DIAZEPAM 5 MG PO TABS
10.0000 mg | ORAL_TABLET | ORAL | Status: AC
  Administered 2023-11-16: 10 mg via ORAL
  Filled 2023-11-16: qty 2

## 2023-11-16 MED ORDER — ACETAMINOPHEN 500 MG PO TABS
500.0000 mg | ORAL_TABLET | Freq: Once | ORAL | Status: AC
  Administered 2023-11-16: 500 mg via ORAL

## 2023-11-16 MED ORDER — CIPROFLOXACIN HCL 500 MG PO TABS
500.0000 mg | ORAL_TABLET | ORAL | Status: AC
  Administered 2023-11-16: 500 mg via ORAL
  Filled 2023-11-16: qty 1

## 2023-11-16 MED ORDER — ACETAMINOPHEN 500 MG PO TABS
ORAL_TABLET | ORAL | Status: AC
  Filled 2023-11-16: qty 1

## 2023-11-16 NOTE — Op Note (Signed)
 See [employer] OP note scanned into chart. Also because of the size, density, location and other factors that cannot be anticipated I feel this will likely be a staged procedure. This fact supersedes any indication in the scanned [place] [facility] operative note to the contrary.

## 2023-11-16 NOTE — Discharge Instructions (Addendum)
 See [facility] discharge instructions in chart.

## 2023-11-16 NOTE — Progress Notes (Signed)
 Instructed Oxycodone 5-325mg  given at 10:56 am and written under medications on discharge instructions. Spouse- Archie Bearded instructed to wait prescribed time before giving more. Verbalized understanding.

## 2023-11-16 NOTE — H&P (Signed)
 -   CC: I have kidney stones.  HPI: Cailean Heacock is a 73 year-old male established patient who is here for renal calculi.  The problem is on the right side.   11/07/23: Stevens returns today in f/u for his history of stones. His UA has 20-40 RBC's. He has had no flank pain or gross hematuria. He has some progressive LUTS and he occasionally wears a pad but hasn't had UUI. His IPSS is 14 with nocturia x 2 and some frequency. His PSA was 1.92 in 3/25.   11/08/22: Jazier returns today in f/u for his history of stones. He has increased his fluid intake and his frequency is up. He will have terminal intermittency 2-3x. His IPSS is 16 with nocturia x 3. He has some urgency but no UUI. He has had no hematuria. He has had no flank pain. The KUB shows stable RLP stones. His UA is clear.   10/04/21: Ludwin returns today for his history of stones. He has had no pain or hematuria but does have daytime frequency with his high fluid intake but he has nocturia x 2. The frequency limits the distance he can go from the house. He has some urgency but no incontinence. He has some intermittency. He feels empty and has a good stream. UA today is clear and the KUB shows stable RLP stones. His IPSS is 15.   08/23/20: Bernadette returns today in f/u. He had a renal US  at his last visit and a KUB in 12/21 that showed stable RLP stones and a small cyst in the LLP. He has had no further hematuria since his last visit and no flank pain. He has stable LUTS with an IPSS of 14 with some hesitancy and intermittency.   05/04/20: 73 year old male with the below noted past medical history. He presents today with concerns of intermittent gross hematuria. He does have a known stone burden within his right lower pole. He has not seen any stones passed. He denies fevers and flank pain. He denies any changes to his voiding habits. He does have a slightly weakened stream but this is been present for some time. He has had a hematuria evaluation with  benign findings in the past.   Milam returns today in f/u for his history of a stone found on a hematuria w/u last year. He has mild LUTS with an IPSS of 11 with nocturia 1-2x. He has some frequency an intermittency. He has had no hematuria or flank pain. He has no associated signs or symptoms. KUB today shows no interval growth in the 18mm partially branched RLP stone. His UA is clear today. His brother may need a renal transplant and he has questions about whether the stone would exclude him from being a donor.       CC: AUA Questions Scoring.  HPI:     AUA Symptom Score: Less than 50% of the time he has the sensation of not emptying his bladder completely when finished urinating. More than 50% of the time he has to urinate again fewer than two hours after he has finished urinating. Less than 20% of the time he has to start and stop again several times when he urinates. Less than 50% of the time he finds it difficult to postpone urination. Less than 50% of the time he has a weak urinary stream. Less than 20% of the time he has to push or strain to begin urination. He has to get up to urinate 2 times from  the time he goes to bed until the time he gets up in the morning.   Calculated AUA Symptom Score: 14    ALLERGIES: No Allergies    MEDICATIONS: Amlodipine  Besylate  Calcium  Cetirizine HCl 1 MG/ML Solution  Co Q10  Ginkgo  Glucosamine Chondroitin  Hyaluronic Acid  Multiple Vitamins  Omega 3  Red Yeast Rice  Saw Palmetto  Turmeric  Vitamin D2     GU PSH: Cystoscopy - 2019 ESWL - 2009 Locm 300-399Mg /Ml Iodine,1Ml - 2019 Vasectomy - 2009       PSH Notes: Lithotripsy, Surgery Of Male Genitalia Vasectomy, Tonsillectomy   NON-GU PSH: Remove Tonsils - 2009 Visit Complexity (formerly GPC1X) - 11/08/2022     GU PMH: BPH w/LUTS, He has moderate LUTS with high fluid intake. He is on Super Beta prostate which has helped some. - 11/08/2022, He has some morning frequency and  urgency. I discussed alpha blockers but he isn't interested. He is on saw palmetto and I suggested he consider Super Beta Prostate if he wants to stick with OTC products as I have had several patients seem to benefit. , - 2023, He has moderate LUTS but is content with his symptoms. , - 2022, He has moderate LUTS but attributes his symptoms to coffee intake. His exam is benign. , - 2021 Nocturia - 11/08/2022, - 2022, He has minimal bother from the LUTS. , - 2020 Renal calculus, His stones are stable. f/u in a year but no imaging unless he has symptoms. - 11/08/2022, His stones are stable. He will return in a year with a KUB. , - 2023, His stone is stable and he has had no further hematuria. , - 2022, - 2021, - 2021, His RLP stone is stable. I don't see a left renal stone but there was one on his prior CT but it was small. I told him that he would need to discuss suitability for kidney donation with the transplant team if his brother needs a kidney., - 2021 (Stable), His RLP stones appear stable. He will return in a year for a UA and KUB. , - 2020, Kidney stone on right side, - 2014 Urinary Frequency (Stable) - 11/08/2022, - 2023 Gross hematuria, the hematuria is most likely from the BPH and not the stone. I will just have him return in a year with a KUB. - 2019, He had an episode of painless gross hematuria in February. The CT just shows slight interval growth of the RLP stones since 2010 but I can't say that is definitely the cause of the hematuria. I will have him return for cystoscopy to complete the evaluation and then will just follow the stones. , - 2019 BPH w/o LUTS, Benign prostatic hypertrophy without lower urinary tract symptoms - 2014 History of urolithiasis, Nephrolithiasis - 2014 Hydronephrosis Unspec, Hydronephrosis - 2014 Ureteral calculus, Calculus of ureter - 2014      PMH Notes:  stomach ulcers     NON-GU PMH: GERD Hypercholesterolemia Hypertension    FAMILY HISTORY: Death - Father,  Mother melanoma - Brother non-Hodgkin's lymphoma - Brother   SOCIAL HISTORY: Marital Status: Married Preferred Language: English; Ethnicity: Not Hispanic Or Latino; Race: White Current Smoking Status: Patient does not smoke anymore.   Tobacco Use Assessment Completed: Used Tobacco in last 30 days? Drinks 1 drink per week.  Drinks 4+ caffeinated drinks per day. Patient's occupation is/was Retired.     Notes: Caffeine Use, Marital History - Currently Married, Occupation:, Alcohol Use, Tobacco Use  REVIEW OF SYSTEMS:    GU Review Male:   Patient reports get up at night to urinate. Patient denies frequent urination, hard to postpone urination, burning/ pain with urination, leakage of urine, stream starts and stops, trouble starting your stream, have to strain to urinate , erection problems, and penile pain.  Gastrointestinal (Upper):   Patient denies nausea, vomiting, and indigestion/ heartburn.  Gastrointestinal (Lower):   Patient denies diarrhea and constipation.  Constitutional:   Patient denies fever, night sweats, weight loss, and fatigue.  Skin:   Patient denies skin rash/ lesion and itching.  Eyes:   Patient denies blurred vision and double vision.  Ears/ Nose/ Throat:   Patient denies sore throat and sinus problems.  Hematologic/Lymphatic:   Patient denies swollen glands and easy bruising.  Cardiovascular:   Patient denies leg swelling and chest pains.  Respiratory:   Patient denies cough and shortness of breath.  Endocrine:   Patient denies excessive thirst.  Musculoskeletal:   Patient denies back pain and joint pain.  Neurological:   Patient denies dizziness and headaches.  Psychologic:   Patient denies depression and anxiety.   Notes: urgency rarely    VITAL SIGNS: None   MULTI-SYSTEM PHYSICAL EXAMINATION:    Constitutional: Well-nourished. No physical deformities. Normally developed. Good grooming.  Respiratory: Normal breath sounds. No labored breathing, no use of  accessory muscles.   Cardiovascular: Regular rate and rhythm. No murmur, no gallop.      Complexity of Data:  Records Review:   AUA Symptom Score, Previous Patient Records  Urine Test Review:   Urinalysis  X-Ray Review: [facility] Protocol: Reviewed Films. Discussed With Patient.     PROCEDURES:         C.T. Urogram - [id number]  Increased bilateral stone burden, right > left.       Patient confirmed No Neulasta OnPro Device.         Visit Complexity - G2211 Chronic management         Urinalysis w/Scope Dipstick Dipstick Cont'd Micro  Color: Yellow Bilirubin: Neg mg/dL WBC/hpf: 0 - 5/hpf  Appearance: Slightly Cloudy Ketones: Neg mg/dL RBC/hpf: 20 - 40/NUU  Specific Gravity: 1.015 Blood: 3+ ery/uL Bacteria: Rare (0-9/hpf)  pH: 7.5 Protein: Neg mg/dL Cystals: NS (Not Seen)  Glucose: Neg mg/dL Urobilinogen: 0.2 mg/dL Casts: NS (Not Seen)    Nitrites: Neg Trichomonas: Not Present    Leukocyte Esterase: Trace leu/uL Mucous: Not Present      Epithelial Cells: NS (Not Seen)      Yeast: NS (Not Seen)      Sperm: Not Present    ASSESSMENT:      ICD-10 Details  1 GU:   Renal calculus - N20.0 Chronic, Worsening - He has an increased stone burden with a 1cm RUP stone and a stable 15mm RLP stone. There is an 8mm LUP stone as well. the RUP stone appears to be mobile in the system and the likley cause of the hematuria.   I discussed management options including observation, ESWL, URS and PCNL.   I will get him set up for ESWL with the understanding that it might need staged treatment with the two stones.   I reviewed the risks of ESWL including bleeding, infection, injury to the kidney or adjacent structures, failure to fragment the stone, need for ancillary procedures, thrombotic events, cardiac arrhythmias and sedation complications.     2   Microscopic hematuria - R31.21 Undiagnosed New Problem  3   BPH w/LUTS -  N40.1 Chronic, Stable  4   Nocturia - R35.1 Chronic, Stable  5   Urinary  Frequency - R35.0 Chronic, Stable   PLAN:            Medications New Meds: Percocet 5-325 MG Tablet 1 tablet PO Q 6 H PRN   #12  0 Refill(s)  Tamsulosin HCl 0.4 MG Capsule 1 capsule PO Daily   #30  1 Refill(s)  Ondansetron 4 MG Tablet Disintegrating 1 tablet PO Q 6 H PRN   #10  1 Refill(s)  Pharmacy Name:  [id number]  Address:  [address]   [place], [place] [address]  Phone:  [phone]  Fax:  [phone]            Orders X-Rays: [facility] Protocol Without I.V. Contrast  X-Ray Notes:   History:   Hematuria: Yes / No   Patient to see MD after exam: Yes/ No   Previous exam:   When:   Where:   Diabetic: Yes / No   BUN/ Creatinine:   Date of last BUN Creatinine:   Weight in pounds:   Allergy- IV Contrast: Yes/ No  Prior Authorization #: [id number]            Schedule Return Visit/Planned Activity: Next Available Appointment - Schedule Surgery  Procedure: 11/07/2023 - ESWL - 53664 Notes: Next available.           Document Letter(s):  Created for Patient: Clinical Summary         Notes:   CC: Ulyses Gandy NP

## 2023-11-19 ENCOUNTER — Encounter (HOSPITAL_COMMUNITY): Payer: Self-pay | Admitting: Urology

## 2023-11-20 ENCOUNTER — Encounter: Payer: Self-pay | Admitting: Orthopedic Surgery

## 2023-11-21 NOTE — Telephone Encounter (Signed)
Message routed to PCP [place], Amy E, NP  

## 2023-11-22 ENCOUNTER — Encounter: Payer: Self-pay | Admitting: Gastroenterology

## 2023-11-22 ENCOUNTER — Ambulatory Visit (INDEPENDENT_AMBULATORY_CARE_PROVIDER_SITE_OTHER): Admitting: Gastroenterology

## 2023-11-22 VITALS — BP 122/62 | HR 67 | Ht 67.0 in | Wt 168.0 lb

## 2023-11-22 DIAGNOSIS — R109 Unspecified abdominal pain: Secondary | ICD-10-CM | POA: Diagnosis not present

## 2023-11-22 DIAGNOSIS — R140 Abdominal distension (gaseous): Secondary | ICD-10-CM | POA: Diagnosis not present

## 2023-11-22 DIAGNOSIS — R509 Fever, unspecified: Secondary | ICD-10-CM

## 2023-11-22 DIAGNOSIS — R1310 Dysphagia, unspecified: Secondary | ICD-10-CM | POA: Diagnosis not present

## 2023-11-22 MED ORDER — PANTOPRAZOLE SODIUM 40 MG PO TBEC: 40.0000 mg | DELAYED_RELEASE_TABLET | Freq: Every day | ORAL | 0 refills | Status: DC

## 2023-11-22 MED ORDER — SUCRALFATE 1 GM/10ML PO SUSP: 1.0000 g | Freq: Four times a day (QID) | ORAL | 0 refills | Status: DC

## 2023-11-22 NOTE — Patient Instructions (Signed)
 We have sent the following medications to your pharmacy for you to pick up at your convenience:  Pantoprazole, Carafate.  Your provider has requested that you go to the basement level for lab work before leaving today. Press B on the elevator. The lab is located at the first door on the left as you exit the elevator.  _______________________________________________________  If your blood pressure at your visit was 140/90 or greater, please contact your primary care physician to follow up on this.  _______________________________________________________  If you are age 21 or older, your body mass index should be between 23-30. Your Body mass index is 26.31 kg/m. If this is out of the aforementioned range listed, please consider follow up with your Primary Care Provider.  If you are age 8 or younger, your body mass index should be between 19-25. Your Body mass index is 26.31 kg/m. If this is out of the aformentioned range listed, please consider follow up with your Primary Care Provider.   ________________________________________________________  The [facility] providers would like to encourage you to use MYCHART to communicate with providers for non-urgent requests or questions.  Due to long hold times on the telephone, sending your provider a message by [facility] may be a faster and more efficient way to get a response.  Please allow 48 business hours for a response.  Please remember that this is for non-urgent requests.  _______________________________________________________

## 2023-11-22 NOTE — Progress Notes (Signed)
 Discussed the use of AI scribe software for clinical note transcription with the patient, who gave verbal consent to proceed.  HPI : Ryan [name] is a 73 year old male who presents with dysphagia and pain following lithotripsy for kidney stones. He was referred by his urologist, and Greig Cluster, his nurse practitioner, for evaluation of his symptoms post-lithotripsy.  He underwent a routine annual appointment with his urologist where hematuria was discovered. A subsequent CT scan revealed nephrolithiasis in both kidneys, leading to lithotripsy on the right kidney last Friday. Following the procedure, he experienced significant swelling, pain, and dysphagia. The dysphagia is described as pain when food reaches a certain point in the esophagus, although the food eventually passes. He denies any previous issues with painful or difficult swallowing.  The pain has improved somewhat in the past 48 hours.   No symptoms of gastroesophageal reflux disease, such as pyrosis or acid regurgitation. He denies headaches or other infectious symptoms.   He has been using acetaminophen  for pain relief, as oxycodone  was ineffective. He also received a medication to help with urinary flow, possibly a generic form of tamsulosin, and believes he may have been given a steroid, though he is unsure of the exact medications.  No NSAID use. He is prescribed intranasal fluticasone but has not used it in several weeks. He doesn't think he was given antibiotics.  He experienced a low-grade fever of 99.58F last night, which resolved by this morning.   He has also been bothered by bloating since the lithotripsy.  He has had issues with bloating in the past, but it had not been a problem again until the past few days. His bowel movements are typically regular, but he sometimes struggles with constipation.  No cardiovascular or pulmonary issues.       Colonoscopy August 2023 (Dr. Rosalie) Sigmoid diverticulosis 3 diminutive  polyps (histology not available) Internal hemorrhoids  Past Medical History:  Diagnosis Date   Bradycardia    Family history of breast cancer    Family history of genetic disease carrier 04/02/2017   Daughter has a CHEK2 c.1100delC pathogenic variant.    Family history of melanoma    Family history of non-Hodgkin's lymphoma    Family hx of melanoma    History of bone density study 2017   Hx of adenomatous colonic polyps    and hyperplastic   Hypertension    Kidney stones    Osteopenia    Peptic ulcer disease    Sleep apnea    CPAP   Vitamin D  deficiency      Past Surgical History:  Procedure Laterality Date   COLONOSCOPY  2009,2015   EXTRACORPOREAL SHOCK WAVE LITHOTRIPSY     EXTRACORPOREAL SHOCK WAVE LITHOTRIPSY Right 11/16/2023   Procedure: LITHOTRIPSY, ESWL;  Surgeon: Elisabeth Valli BIRCH, MD;  Location: [facility];  Service: Urology;  Laterality: Right;   MOUTH SURGERY  05/29/2012   TONSILLECTOMY AND ADENOIDECTOMY  05/29/1970   VASECTOMY  05/30/1979   Family History  Problem Relation Age of Onset   Broken bones Father    Melanoma Brother 72   Non-Hodgkin's lymphoma Brother 89   CAD Brother    Breast cancer Daughter 98       CHEK2+   Heart attack Paternal Uncle 47   Parkinson's disease Maternal Grandmother    Cancer Cousin 39       type unk   Social History   Tobacco Use   Smoking status: Former    Current  packs/day: 0.00    Types: Cigarettes    Quit date: 06/29/1970    Years since quitting: 53.4   Smokeless tobacco: Never  Vaping Use   Vaping status: Never Used  Substance Use Topics   Alcohol use: Yes    Comment: 1 drink week   Drug use: Never   Current Outpatient Medications  Medication Sig Dispense Refill   amLODipine  (NORVASC ) 5 MG tablet Take 1.5 tablets (7.5 mg total) by mouth daily. 120 tablet 3   B Complex-C (B COMPLEX-VITAMIN C PO) Take 500 mg by mouth daily.     Calcium Carbonate Antacid (CALCIUM CARBONATE PO) Take by mouth 4 (four) times a week.      Coenzyme Q10 (CO Q-10) 100 MG CAPS Take 1 capsule by mouth daily.     docusate sodium (COLACE) 100 MG capsule Take 100 mg by mouth daily as needed for mild constipation.     Durapatite, Hydroxyapatite, (CALCIUM HYDROXYAPATITE) POWD Take 500 mg by mouth daily.     Fluticasone Propionate (FLONASE NA) Place into the nose as needed.     Ginkgo Biloba 100 MG CAPS Take 1 capsule by mouth daily.     GLUCOSAMINE-CHONDROITIN PO Take 1,500 mg by mouth daily.     Loratadine (CLARITIN PO) Take by mouth as needed.     Multiple Vitamin (MULTIVITAMIN) tablet Take 1 tablet by mouth daily.     NON FORMULARY Take 600 mg by mouth daily. Calcium 600 mg plus vit D3     Omega-3 Fatty Acids (OMEGA 3 PO) Take 1 tablet by mouth daily.     OVER THE COUNTER MEDICATION Super beta prostate 1 capsule twice daily     OVER THE COUNTER MEDICATION Apply 0.5 inches topically daily as needed (pain). Voltaren cream     PRESCRIPTION MEDICATION Place 2 drops into both eyes daily. Eye drops for conjunctivitis. Started 4 per day for 1st week then 2 drops per day     Red Yeast Rice Extract (RED YEAST RICE PO) Take 1,200 mg by mouth daily at 12 noon.     Sodium Hyaluronate, oral, (HYALURONIC ACID) 100 MG CAPS Take 1 capsule by mouth in the morning and at bedtime.     TURMERIC PO Take 800 mg by mouth daily.     Vitamin D , Ergocalciferol , (DRISDOL ) 1.25 MG (50000 UNIT) CAPS capsule TAKE 1 CAPSULE EVERY 14 DAYS. 6 capsule 3   No current facility-administered medications for this visit.   No Known Allergies   Review of Systems: All systems reviewed and negative except where noted in HPI.    DG Abd 1 View Result Date: 11/16/2023 CLINICAL DATA:  Right-sided renal stones EXAM: ABDOMEN - 1 VIEW COMPARISON:  Abdominal radiograph dated 11/08/2022 FINDINGS: Multifocal radiopaque stones measuring up to 11 mm projecting over the expected location of the right kidney irregular calcification in the left upper quadrant may represent a left  upper pole renal stone. Moderate volume stool throughout the colon. Nonobstructive bowel gas pattern. Mild levoconvex curvature of the lumbar spine. IMPRESSION: 1. Multifocal radiopaque stones measuring up to 11 mm projecting over the expected location of the right kidney. 2. Irregular calcification in the left upper quadrant may represent a left upper pole renal stone. Electronically Signed   By: Limin  Xu M.D.   On: 11/16/2023 12:43    Physical Exam: BP 122/62   Pulse 67   Ht 5' 7 (1.702 m)   Wt 168 lb (76.2 kg)   BMI 26.31 kg/m  Constitutional:  Pleasant,well-developed, Caucasian male in no acute distress. HEENT: Normocephalic and atraumatic. Conjunctivae are normal. No scleral icterus. Neck supple.  Cardiovascular: Normal rate, regular rhythm.  Pulmonary/chest: Effort normal and breath sounds normal. No wheezing, rales or rhonchi. Abdominal: Soft, nondistended, nontender. Bowel sounds active throughout. There are no masses palpable. No hepatomegaly. Extremities: no edema Lymphadenopathy: No cervical adenopathy noted. Neurological: Alert and oriented to person place and time. Skin: Skin is warm and dry. No rashes noted. Psychiatric: Normal mood and affect. Behavior is normal.  CBC    Component Value Date/Time   WBC 4.1 02/22/2023 0902   RBC 5.20 02/22/2023 0902   HGB 16.6 02/22/2023 0902   HCT 49.0 02/22/2023 0902   PLT 225 02/22/2023 0902   MCV 94.2 02/22/2023 0902   MCH 31.9 02/22/2023 0902   MCHC 33.9 02/22/2023 0902   RDW 12.3 02/22/2023 0902   LYMPHSABS 1,099 02/22/2023 0902   EOSABS 90 02/22/2023 0902   BASOSABS 49 02/22/2023 0902    CMP     Component Value Date/Time   NA 140 08/23/2023 0905   K 4.4 08/23/2023 0905   CL 105 08/23/2023 0905   CO2 28 08/23/2023 0905   GLUCOSE 88 08/23/2023 0905   BUN 23 08/23/2023 0905   CREATININE 0.92 08/23/2023 0905   CALCIUM 10.3 08/23/2023 0905   PROT 6.6 08/23/2023 0905   AST 23 08/23/2023 0905   ALT 29 08/23/2023 0905    BILITOT 1.4 (H) 08/23/2023 0905       Latest Ref Rng & Units 02/22/2023    9:02 AM 08/17/2022    8:58 AM 02/16/2022   11:41 AM  CBC EXTENDED  WBC 3.8 - 10.8 Thousand/uL 4.1  4.5  3.9   RBC 4.20 - 5.80 Million/uL 5.20  5.38  5.08   Hemoglobin 13.2 - 17.1 g/dL 83.3  82.8  83.5   HCT 38.5 - 50.0 % 49.0  50.3  46.7   Platelets 140 - 400 Thousand/uL 225  226  203   NEUT# 1,500 - 7,800 cells/uL 2,546  2,880  2,375   Lymph# 850 - 3,900 cells/uL 1,099  1,161  1,026       ASSESSMENT AND PLAN:  73 year old male with abrupt onset odynophagia following lithotripsy.  History suggest infectious or esophagitis, although patient does not appear to have any risk factors for either.  Symptoms have been improving over the last 48 hours.  I recommended upper endoscopy, but the next available endoscopy slot is not late August. I suspect his symptoms will continue to improve.  I will add PPI and Carafate.  If he is still having pain with swallowing 10-14 days, will plan for expedited EGD at that point. Patient has history of bloating, which recurred recently.  She may be related to constipation, but will check H. pylori stool antigen.   Odynophagia Acute odynophagia post-lithotripsy, possible esophageal infection or pill esophagitis. Symptoms slightly improved.  No GERD symptoms. - No routine endoscopy slots available until late August. - Prescribe Carafate for esophageal protection. - Prescribe pantoprazole 40 mg daily for two weeks. - Advise soft food diet. - If symptoms not resolved by 10-14 days, will schedule for expedited EGD  Bloating and abdominal pain - Order H. pylori Diatherix stool test.  Low-grade fever Fever possibly due to esophageal infection or post-procedural inflammation.  Recording duration: 17 minutes     Rukia Mcgillivray E. Stacia, MD  Gastroenterology    Gil Greig BRAVO, NP

## 2023-11-23 ENCOUNTER — Other Ambulatory Visit

## 2023-11-23 DIAGNOSIS — R1310 Dysphagia, unspecified: Secondary | ICD-10-CM

## 2023-11-23 DIAGNOSIS — R140 Abdominal distension (gaseous): Secondary | ICD-10-CM

## 2023-11-23 NOTE — Progress Notes (Unsigned)
heli

## 2023-11-26 DIAGNOSIS — N200 Calculus of kidney: Secondary | ICD-10-CM | POA: Diagnosis not present

## 2023-11-26 DIAGNOSIS — N99820 Postprocedural hemorrhage and hematoma of a genitourinary system organ or structure following a genitourinary system procedure: Secondary | ICD-10-CM | POA: Diagnosis not present

## 2023-11-26 LAB — HELICOBACTER PYLORI  SPECIAL ANTIGEN
MICRO NUMBER:: 16634390
SPECIMEN QUALITY: ADEQUATE

## 2023-11-26 LAB — CBC AND DIFFERENTIAL
HCT: 28 — AB (ref 41–53)
Hemoglobin: 9.3 — AB (ref 13.5–17.5)
Platelets: 479 K/uL — AB (ref 150–400)
WBC: 7.3

## 2023-11-26 LAB — CBC: RBC: 2.85 — AB (ref 3.87–5.11)

## 2023-11-27 ENCOUNTER — Ambulatory Visit: Payer: Self-pay | Admitting: Gastroenterology

## 2023-11-27 NOTE — Progress Notes (Signed)
 Ryan [name],  Your H. Pylori test was negative (not present).  Are your swallowing symptoms continuing to improve?

## 2023-11-29 DIAGNOSIS — N200 Calculus of kidney: Secondary | ICD-10-CM | POA: Diagnosis not present

## 2023-11-29 DIAGNOSIS — S37021A Major contusion of right kidney, initial encounter: Secondary | ICD-10-CM | POA: Diagnosis not present

## 2023-11-29 DIAGNOSIS — N99820 Postprocedural hemorrhage and hematoma of a genitourinary system organ or structure following a genitourinary system procedure: Secondary | ICD-10-CM | POA: Diagnosis not present

## 2023-11-29 LAB — CBC AND DIFFERENTIAL
HCT: 29 — AB (ref 41–53)
Hemoglobin: 9.7 — AB (ref 13.5–17.5)

## 2023-12-05 DIAGNOSIS — R350 Frequency of micturition: Secondary | ICD-10-CM | POA: Diagnosis not present

## 2023-12-05 DIAGNOSIS — N201 Calculus of ureter: Secondary | ICD-10-CM | POA: Diagnosis not present

## 2023-12-05 DIAGNOSIS — N132 Hydronephrosis with renal and ureteral calculous obstruction: Secondary | ICD-10-CM | POA: Diagnosis not present

## 2023-12-05 LAB — BASIC METABOLIC PANEL WITH GFR
BUN: 22 — AB (ref 4–21)
CO2: 28 — AB (ref 13–22)
Chloride: 102 (ref 99–108)
Creatinine: 1 (ref 0.6–1.3)
Glucose: 91
Potassium: 4.4 meq/L (ref 3.5–5.1)
Sodium: 141 (ref 137–147)

## 2023-12-05 LAB — CBC AND DIFFERENTIAL
HCT: 32 — AB (ref 41–53)
Hemoglobin: 10.4 — AB (ref 13.5–17.5)

## 2023-12-05 LAB — COMPREHENSIVE METABOLIC PANEL WITH GFR: Calcium: 9.5 (ref 8.7–10.7)

## 2023-12-06 ENCOUNTER — Encounter: Payer: Self-pay | Admitting: Adult Health

## 2023-12-06 ENCOUNTER — Ambulatory Visit: Admitting: Adult Health

## 2023-12-06 ENCOUNTER — Other Ambulatory Visit: Payer: Self-pay | Admitting: Adult Health

## 2023-12-06 ENCOUNTER — Encounter: Payer: Self-pay | Admitting: Orthopedic Surgery

## 2023-12-06 ENCOUNTER — Telehealth: Payer: Self-pay

## 2023-12-06 VITALS — BP 122/72 | HR 68 | Temp 97.9°F | Resp 20 | Ht 67.0 in | Wt 177.0 lb

## 2023-12-06 DIAGNOSIS — R600 Localized edema: Secondary | ICD-10-CM

## 2023-12-06 MED ORDER — FUROSEMIDE 20 MG PO TABS: 20.0000 mg | ORAL_TABLET | Freq: Every day | ORAL | 3 refills | Status: DC

## 2023-12-06 NOTE — Telephone Encounter (Signed)
 Copied from CRM [id number]. Topic: General - Other >> Dec 06, 2023  9:08 AM Susanna ORN wrote: Reason for CRM: Mitchael Moats, PA with [facility], called to speak with Dr. Gil or her nurse in regards to patient. Called [facility] & was told neither one of them was in office today. Was able to get Janea over to [place]. Janea stated that she thinks patient is needing to be seen within the next 24 hours.  Please Advise, Spoke with Jennaya Davis, PA from [facility] stating that she faxing over notes from [facility] concerning about the patient and she also having her personal cell phone number to talk to her and it is [phone]  Message sent to Gil Greig BRAVO, NP [employer], Bari is covering her in basket)

## 2023-12-06 NOTE — Progress Notes (Signed)
 Location:  [facility]   Place of Service:   psc clinic   CODE STATUS:   No Known Allergies  Chief Complaint  Patient presents with   Edema    HPI:  Had lithotripsy 3 weeks ago on right side had a hematoma on right kidney. He is having increased edema; in his legs and scrotum. Does have leg pain due to edema with ambulation. When he takes a deep breath he will cough. No worsening shortness of breath with exercise. He had been to urology; had an x-ray performed and is here for follow up with the x-ray.   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    Family history of melanoma    Family history of non-Hodgkin's lymphoma    Family hx of melanoma    History of bone density study 2017   Hx of adenomatous colonic polyps    and hyperplastic   Hypertension    Kidney stones    Osteopenia    Peptic ulcer disease    Sleep apnea    CPAP   Vitamin D  deficiency     Past Surgical History:  Procedure Laterality Date   COLONOSCOPY  2009,2015   EXTRACORPOREAL SHOCK WAVE LITHOTRIPSY     EXTRACORPOREAL SHOCK WAVE LITHOTRIPSY Right 11/16/2023   Procedure: LITHOTRIPSY, ESWL;  Surgeon: Elisabeth Valli BIRCH, MD;  Location: [facility];  Service: Urology;  Laterality: Right;   MOUTH SURGERY  05/29/2012   TONSILLECTOMY AND ADENOIDECTOMY  05/29/1970   VASECTOMY  05/30/1979    Social History   Socioeconomic History   Marital status: Married    Spouse name: Not on file   Number of children: 2   Years of education: Not on file   Highest education level: Master's degree (e.g., MA, MS, MEng, MEd, MSW, MBA)  Occupational History   Occupation: RETIRED  Tobacco Use   Smoking status: Former    Current packs/day: 0.00    Types: Cigarettes    Quit date: 06/29/1970    Years since quitting: 53.4   Smokeless tobacco: Never  Vaping Use    Vaping status: Never Used  Substance and Sexual Activity   Alcohol use: Yes    Comment: 1 drink week   Drug use: Never   Sexual activity: Not on file  Other Topics Concern   Not on file  Social History Narrative   Tobacco use, amount per day now: None   Past tobacco use, amount per day: 1 pack.   How many years did you use tobacco: 3 years   Alcohol use (drinks per week): 2 per week.    Diet: Watching Calories.    Do you drink/eat things with caffeine: Yes, mainly coffee.   Marital status:   Married                               What year were you married? 1972   Do you live in a house, apartment, assisted living, condo, trailer, etc.? House.   Is it one or more stories? More   How many persons live in your home? 2   Do you have pets in your home?( please list) Dog   Highest Level of education completed? Masters Degree.    Current or past profession: Clinical biochemist.  Do you exercise?     Yes                             Type and how often? Walk every day, Approximately 3.5 miles.     Do you have a living will? Yes   Do you have a DNR form?                                   If not, do you want to discuss one?   Do you have signed POA/HPOA forms?   Yes                     If so, please bring to you appointment      Do you have any difficulty bathing or dressing yourself? No   Do you have any difficulty preparing food or eating? No   Do you have any difficulty managing your medications? No   Do you have any difficulty managing your finances? No   Do you have any difficulty affording your medications? No   Social Drivers of Corporate investment banker Strain: Low Risk  (08/19/2023)   Overall Financial Resource Strain (CARDIA)    Difficulty of Paying Living Expenses: Not hard at all  Food Insecurity: No Food Insecurity (08/19/2023)   Hunger Vital Sign    Worried About Running Out of Food in the Last Year: Never true    Ran Out of Food in the Last Year: Never true   Transportation Needs: No Transportation Needs (08/19/2023)   PRAPARE - Administrator, Civil Service (Medical): No    Lack of Transportation (Non-Medical): No  Physical Activity: Sufficiently Active (08/19/2023)   Exercise Vital Sign    Days of Exercise per Week: 7 days    Minutes of Exercise per Session: 40 min  Stress: No Stress Concern Present (08/19/2023)   [employer] of Occupational Health - Occupational Stress Questionnaire    Feeling of Stress : Not at all  Social Connections: Socially Integrated (08/19/2023)   Social Connection and Isolation Panel    Frequency of Communication with Friends and Family: Once a week    Frequency of Social Gatherings with Friends and Family: Three times a week    Attends Religious Services: More than 4 times per year    Active Member of Clubs or Organizations: Yes    Attends Banker Meetings: More than 4 times per year    Marital Status: Married  Catering manager Violence: Not At Risk (08/23/2023)   Humiliation, Afraid, Rape, and Kick questionnaire    Fear of Current or Ex-Partner: No    Emotionally Abused: No    Physically Abused: No    Sexually Abused: No   Family History  Problem Relation Age of Onset   Broken bones Father    Melanoma Brother 77   Non-Hodgkin's lymphoma Brother 45   CAD Brother    Parkinson's disease Maternal Grandmother    Breast cancer Daughter 28       CHEK2+   Heart attack Paternal Uncle 70   Cancer Cousin 31       type unk   Liver disease Neg Hx    Esophageal cancer Neg Hx    Colon cancer Neg Hx       VITAL SIGNS BP 122/72   Pulse 68  Temp 97.9 F (36.6 C)   Resp 20   Ht 5' 7 (1.702 m)   Wt 177 lb (80.3 kg)   SpO2 99%   BMI 27.72 kg/m   Outpatient Encounter Medications as of 12/06/2023  Medication Sig Note   amLODipine  (NORVASC ) 5 MG tablet Take 1.5 tablets (7.5 mg total) by mouth daily. 11/16/2023: taking   B Complex-C (B COMPLEX-VITAMIN C PO) Take 500 mg by mouth  daily.    Calcium Carbonate Antacid (CALCIUM CARBONATE PO) Take by mouth 4 (four) times a week.    Coenzyme Q10 (CO Q-10) 100 MG CAPS Take 1 capsule by mouth daily.    docusate sodium (COLACE) 100 MG capsule Take 100 mg by mouth daily as needed for mild constipation.    Fluticasone Propionate (FLONASE NA) Place into the nose as needed.    Ginkgo Biloba 100 MG CAPS Take 1 capsule by mouth daily.    GLUCOSAMINE-CHONDROITIN PO Take 1,500 mg by mouth daily.    Loratadine (CLARITIN PO) Take by mouth as needed.    Multiple Vitamin (MULTIVITAMIN) tablet Take 1 tablet by mouth daily.    NON FORMULARY Take 600 mg by mouth daily. Calcium 600 mg plus vit D3    Omega-3 Fatty Acids (OMEGA 3 PO) Take 1 tablet by mouth daily.    OVER THE COUNTER MEDICATION Super beta prostate 1 capsule twice daily    OVER THE COUNTER MEDICATION Apply 0.5 inches topically daily as needed (pain). Voltaren cream (Patient taking differently: Apply 0.5 inches topically daily as needed (pain). Voltaren cream)    Red Yeast Rice Extract (RED YEAST RICE PO) Take 1,200 mg by mouth daily at 12 noon.    Sodium Hyaluronate, oral, (HYALURONIC ACID) 100 MG CAPS Take 1 capsule by mouth in the morning and at bedtime.    TURMERIC PO Take 800 mg by mouth daily.    Vitamin D , Ergocalciferol , (DRISDOL ) 1.25 MG (50000 UNIT) CAPS capsule TAKE 1 CAPSULE EVERY 14 DAYS. 11/08/2023: Taking as prescribed   Durapatite, Hydroxyapatite, (CALCIUM HYDROXYAPATITE) POWD Take 500 mg by mouth daily. (Patient not taking: Reported on 12/06/2023) 11/08/2023: Not taking   pantoprazole  (PROTONIX ) 40 MG tablet Take 1 tablet (40 mg total) by mouth daily. (Patient not taking: Reported on 12/06/2023)    sucralfate  (CARAFATE ) 1 GM/10ML suspension Take 10 mLs (1 g total) by mouth 4 (four) times daily. (Patient not taking: Reported on 12/06/2023)    No facility-administered encounter medications on file as of 12/06/2023.     SIGNIFICANT DIAGNOSTIC EXAMS  Review of Systems   Constitutional:  Negative for malaise/fatigue.  Respiratory:  Negative for cough and shortness of breath.   Cardiovascular:  Positive for leg swelling. Negative for chest pain and palpitations.  Gastrointestinal:  Negative for abdominal pain, constipation and heartburn.  Musculoskeletal:  Negative for back pain, joint pain and myalgias.  Skin: Negative.   Neurological:  Negative for dizziness.  Psychiatric/Behavioral:  The patient is not nervous/anxious.    Physical Exam Constitutional:      General: He is not in acute distress.    Appearance: He is well-developed. He is not diaphoretic.  Neck:     Thyroid: No thyromegaly.  Cardiovascular:     Rate and Rhythm: Normal rate and regular rhythm.     Pulses: Normal pulses.     Heart sounds: Normal heart sounds.  Pulmonary:     Effort: Pulmonary effort is normal. No respiratory distress.     Breath sounds: Normal breath sounds.  Abdominal:  General: Bowel sounds are normal. There is no distension.     Palpations: Abdomen is soft.     Tenderness: There is no abdominal tenderness.  Musculoskeletal:        General: Normal range of motion.     Cervical back: Neck supple.     Right lower leg: Edema present.     Left lower leg: Edema present.     Comments: Has 3+ bilateral lower edema  Has scrotal edema as well  Lymphadenopathy:     Cervical: No cervical adenopathy.  Skin:    General: Skin is warm and dry.  Neurological:     Mental Status: He is alert and oriented to person, place, and time.  Psychiatric:        Mood and Affect: Mood normal.       ASSESSMENT/ PLAN:  TODAY  Bilateral lower extremity edema: will begin lasix  20 mg daily k+ is 4.4 will not supplement at this time.   [phone]   Barnie Seip NP [facility]   call [phone]

## 2023-12-06 NOTE — Patient Instructions (Signed)
 Begin lasix  20 mg daily  Please eat banana daily Will need to check BMP in 2 weeks.

## 2023-12-06 NOTE — Telephone Encounter (Addendum)
 Spoke with patient this morning to let him know that I spoke with Sherlyn Moats, PA from [facility] that he needed to be seen today in office for concerning of swelling. Patient agree and does has appointment today with Landy Perry, GORMAN NP at 2:00 pm. Rowan Lye, NP and Sherlyn Moats, PA from [facility] has been contacted and notified.  Message sent to Gil Greig BRAVO, NP

## 2023-12-06 NOTE — Telephone Encounter (Signed)
 Copied from CRM [id number]. Topic: General - Call Back - No Documentation >> Dec 06, 2023  2:54 PM Brittney F wrote: Reason for CRM:   Patient's PA, Sherlyn Moats called in stating that she had missed about four calls from the office; When asked to hold she stated she was really busy and unable to hold. If the clinical staff was looking to reach her she will be following up via message on Epic.   Callback Number: [phone]  I spoke with Sherlyn Moats just to let her know that the patient was here in office for visit.

## 2023-12-10 DIAGNOSIS — N201 Calculus of ureter: Secondary | ICD-10-CM | POA: Diagnosis not present

## 2023-12-11 ENCOUNTER — Telehealth: Payer: Self-pay | Admitting: Cardiology

## 2023-12-11 ENCOUNTER — Other Ambulatory Visit: Payer: Self-pay | Admitting: Urology

## 2023-12-11 NOTE — Telephone Encounter (Signed)
   Pre-operative Risk Assessment    Patient Name: Ryan [name]  DOB: 1951/03/04 MRN: [id number]   Date of last office visit: 07/09/15 Date of next office visit: 01/15/24   Request for Surgical Clearance    Procedure:  right ureteropathy stone removal   Date of Surgery:  Clearance 02/12/24                                Surgeon:  Dr Atlas Socks Group or Practice Name:  [facility] Phone number:  [phone] Fax number:  [phone]   Type of Clearance Requested:   - Medical  - Pharmacy:  Hold        Type of Anesthesia:  General    Additional requests/questions:    SignedBarbee DELENA Sharps   12/11/2023, 11:32 AM

## 2023-12-12 NOTE — Telephone Encounter (Signed)
   Name: Ryan [name]  DOB: 12-09-1950  MRN: [id number]  Primary Cardiologist: None  Chart reviewed as part of pre-operative protocol coverage. The patient has an upcoming visit scheduled with Dr. Jeffrie on 8/19/2025at which time clearance can be addressed in case there are any issues that would impact surgical recommendations.   Right ureteropathy stone removal Is not scheduled until 02/12/2024 as below. I added preop FYI to appointment note so that provider is aware to address at time of outpatient visit.  Per office protocol the cardiology provider should forward their finalized clearance decision and recommendations regarding antiplatelet therapy to the requesting party below.     I will route this message as FYI to requesting party and remove this message from the preop box as separate preop APP input not needed at this time.   Please call with any questions.  Lamarr Satterfield, NP  12/12/2023, 9:48 AM

## 2023-12-12 NOTE — Telephone Encounter (Signed)
 Patient has an upcoming appointment on 01/15/24 with Dr. Kate clearance can be addressed at ov note has been made in appointment line

## 2023-12-13 ENCOUNTER — Encounter: Payer: Self-pay | Admitting: Adult Health

## 2024-01-14 NOTE — Progress Notes (Unsigned)
 Cardiology Office Note:    Date:  01/15/2024   ID:  Ryan [name], DOB August 08, 1950, MRN [id number]  PCP:  Ryan Greig BRAVO, NP  Cardiologist:  None  Electrophysiologist:  None   Referring MD: Ryan Greig BRAVO, NP   Chief Complaint  Patient presents with   Pre-op Exam    History of Present Illness:    Ryan [name] is a 73 y.o. male with a hx of hypertension, OSA, PUD, nephrolithiasis who presents for an initial visit.  Previously saw Dr. Jeffrie in 2017 given strong family history of CAD.  Underwent ETT 06/2015 which showed no evidence of ischemia.  He is being seen today for preop evaluation, planning ureteroscopy stone removal.  He reports he is very active, walks at least 10,000 steps per day.  Can walk up 2 flights of stairs without stopping.  Denies any exertional chest pain or dyspnea.  Does report some lightheadedness when he stands up first thing in the morning but none the rest of the day.  He denies any syncope.  Had some lower extremity edema after recent lithotripsy and started on Lasix  but has resolved.  He is off Lasix , has been using compression stockings.  He denies any palpitations.  Reports he checks his BP daily, has been 130s to 140s.  He smoked in college, was up to 1 pack/day but quit in his early 38s.  Family history includes brother had MI in 55s.    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    Family history of melanoma    Family history of non-Hodgkin's lymphoma    Family hx of melanoma    History of bone density study 2017   Hx of adenomatous colonic polyps    and hyperplastic   Hypertension    Kidney stones    Osteopenia    Peptic ulcer disease    Sleep apnea    CPAP   Vitamin D  deficiency     Past Surgical History:  Procedure Laterality Date   COLONOSCOPY  2009,2015   EXTRACORPOREAL SHOCK WAVE  LITHOTRIPSY     EXTRACORPOREAL SHOCK WAVE LITHOTRIPSY Right 11/16/2023   Procedure: LITHOTRIPSY, ESWL;  Surgeon: Elisabeth Valli BIRCH, MD;  Location: [facility];  Service: Urology;  Laterality: Right;   MOUTH SURGERY  05/29/2012   TONSILLECTOMY AND ADENOIDECTOMY  05/29/1970   VASECTOMY  05/30/1979    Current Medications: Current Meds  Medication Sig   B Complex-C (B COMPLEX-VITAMIN C PO) Take 500 mg by mouth daily.   Calcium Carbonate Antacid (CALCIUM CARBONATE PO) Take by mouth 4 (four) times a week.   Coenzyme Q10 (CO Q-10) 100 MG CAPS Take 1 capsule by mouth daily.   docusate sodium (COLACE) 100 MG capsule Take 100 mg by mouth daily as needed for mild constipation.   Fluticasone Propionate (FLONASE NA) Place into the nose as needed.   Ginkgo Biloba 100 MG CAPS Take 1 capsule by mouth daily.   GLUCOSAMINE-CHONDROITIN PO Take 1,500 mg by mouth daily.   Loratadine (CLARITIN PO) Take by mouth as needed.   Multiple Vitamin (MULTIVITAMIN) tablet Take 1 tablet by mouth daily.   NON FORMULARY Take 600 mg by mouth daily. Calcium 600 mg plus vit D3   Omega-3 Fatty Acids (OMEGA 3 PO) Take 1 tablet by mouth daily.   OVER THE  COUNTER MEDICATION Super beta prostate 1 capsule twice daily   OVER THE COUNTER MEDICATION Apply 0.5 inches topically daily as needed (pain). Voltaren cream   Red Yeast Rice Extract (RED YEAST RICE PO) Take 1,200 mg by mouth daily at 12 noon.   Sodium Hyaluronate, oral, (HYALURONIC ACID) 100 MG CAPS Take 1 capsule by mouth in the morning and at bedtime.   TURMERIC PO Take 800 mg by mouth daily.   Vitamin D , Ergocalciferol , (DRISDOL ) 1.25 MG (50000 UNIT) CAPS capsule TAKE 1 CAPSULE EVERY 14 DAYS.   [DISCONTINUED] amLODipine  (NORVASC ) 5 MG tablet Take 1.5 tablets (7.5 mg total) by mouth daily.     Allergies:   Patient has no known allergies.   Social History   Socioeconomic History   Marital status: Married    Spouse name: Not on file   Number of children: 2   Years of  education: Not on file   Highest education level: Master's degree (e.g., MA, MS, MEng, MEd, MSW, MBA)  Occupational History   Occupation: RETIRED  Tobacco Use   Smoking status: Former    Current packs/day: 0.00    Types: Cigarettes    Quit date: 06/29/1970    Years since quitting: 53.5   Smokeless tobacco: Never  Vaping Use   Vaping status: Never Used  Substance and Sexual Activity   Alcohol use: Yes    Comment: 1 drink week   Drug use: Never   Sexual activity: Not on file  Other Topics Concern   Not on file  Social History Narrative   Tobacco use, amount per day now: None   Past tobacco use, amount per day: 1 pack.   How many years did you use tobacco: 3 years   Alcohol use (drinks per week): 2 per week.    Diet: Watching Calories.    Do you drink/eat things with caffeine: Yes, mainly coffee.   Marital status:   Married                               What year were you married? 1972   Do you live in a house, apartment, assisted living, condo, trailer, etc.? House.   Is it one or more stories? More   How many persons live in your home? 2   Do you have pets in your home?( please list) Dog   Highest Level of education completed? Masters Degree.    Current or past profession: Clinical biochemist.    Do you exercise?     Yes                             Type and how often? Walk every day, Approximately 3.5 miles.     Do you have a living will? Yes   Do you have a DNR form?                                   If not, do you want to discuss one?   Do you have signed POA/HPOA forms?   Yes                     If so, please bring to you appointment      Do you have any difficulty bathing or dressing yourself? No   Do you have  any difficulty preparing food or eating? No   Do you have any difficulty managing your medications? No   Do you have any difficulty managing your finances? No   Do you have any difficulty affording your medications? No   Social Drivers of Research scientist (physical sciences) Strain: Low Risk  (08/19/2023)   Overall Financial Resource Strain (CARDIA)    Difficulty of Paying Living Expenses: Not hard at all  Food Insecurity: No Food Insecurity (08/19/2023)   Hunger Vital Sign    Worried About Running Out of Food in the Last Year: Never true    Ran Out of Food in the Last Year: Never true  Transportation Needs: No Transportation Needs (08/19/2023)   PRAPARE - Administrator, Civil Service (Medical): No    Lack of Transportation (Non-Medical): No  Physical Activity: Sufficiently Active (08/19/2023)   Exercise Vital Sign    Days of Exercise per Week: 7 days    Minutes of Exercise per Session: 40 min  Stress: No Stress Concern Present (08/19/2023)   [employer] of Occupational Health - Occupational Stress Questionnaire    Feeling of Stress : Not at all  Social Connections: Socially Integrated (08/19/2023)   Social Connection and Isolation Panel    Frequency of Communication with Friends and Family: Once a week    Frequency of Social Gatherings with Friends and Family: Three times a week    Attends Religious Services: More than 4 times per year    Active Member of Clubs or Organizations: Yes    Attends Engineer, structural: More than 4 times per year    Marital Status: Married     Family History: The patient's family history includes Breast cancer (age of onset: 1) in his daughter; Broken bones in his father; CAD in his brother; Cancer (age of onset: 33) in his cousin; Heart attack (age of onset: 22) in his paternal uncle; Melanoma (age of onset: 2) in his brother; Non-Hodgkin's lymphoma (age of onset: 56) in his brother; Parkinson's disease in his maternal grandmother. There is no history of Liver disease, Esophageal cancer, or Colon cancer.  ROS:   Please see the history of present illness.     All other systems reviewed and are negative.  EKGs/Labs/Other Studies Reviewed:    The following studies were reviewed  today:   EKG:   01/15/24: NSR, rate 70, left axis deviation  Recent Labs: 08/23/2023: ALT 29 11/26/2023: Platelets 479 12/05/2023: BUN 22; Creatinine 1.0; Hemoglobin 10.4; Potassium 4.4; Sodium 141  Recent Lipid Panel    Component Value Date/Time   CHOL 216 (H) 08/23/2023 0905   TRIG 107 08/23/2023 0905   HDL 79 08/23/2023 0905   CHOLHDL 2.7 08/23/2023 0905   LDLCALC 116 (H) 08/23/2023 0905    Physical Exam:    VS:  BP (!) 140/76   Pulse 70   Ht 5' 7 (1.702 m)   Wt 168 lb 2 oz (76.3 kg)   SpO2 99%   BMI 26.33 kg/m     Wt Readings from Last 3 Encounters:  01/15/24 168 lb 2 oz (76.3 kg)  12/06/23 177 lb (80.3 kg)  11/22/23 168 lb (76.2 kg)     GEN:  Well nourished, well developed in no acute distress HEENT: Normal NECK: No JVD; No carotid bruits LYMPHATICS: No lymphadenopathy CARDIAC: RRR, no murmurs, rubs, gallops RESPIRATORY:  Clear to auscultation without rales, wheezing or rhonchi  ABDOMEN: Soft, non-tender, non-distended MUSCULOSKELETAL:  No edema; No  deformity  SKIN: Warm and dry NEUROLOGIC:  Alert and oriented x 3 PSYCHIATRIC:  Normal affect   ASSESSMENT:    1. Pre-op evaluation   2. Primary hypertension   3. Lower extremity edema   4. Hyperlipidemia, unspecified hyperlipidemia type    PLAN:    Preop evaluation: Planning ureteroscopy stone removal.  Good functional capacity, greater than 4 METS.  Denies any anginal symptoms.  RCRI score 0.  No further cardiac workup recommended  Hypertension: On amlodipine  7.5 mg daily.  BP elevated in clinic today and reports elevated when he checks at home, will increase to 10 mg daily.  Asked to check BP daily for next 2 weeks and let us  know results  Lower extremity edema: Reports had issues with lower extremity edema after his lithotripsy and was on Lasix  briefly but has resolved.  Currently off Lasix  and appears euvolemic on exam  Hyperlipidemia: LDL 116 on 08/23/2023.  Does have family history of CAD.  Will check  calcium score to guide how aggressive to be in lowering cholesterol  RTC in 6 months  Medication Adjustments/Labs and Tests Ordered: Current medicines are reviewed at length with the patient today.  Concerns regarding medicines are outlined above.  Orders Placed This Encounter  Procedures   CT CARDIAC SCORING (SELF PAY ONLY)   EKG 12-Lead   Meds ordered this encounter  Medications   amLODipine  (NORVASC ) 10 MG tablet    Sig: Take 1 tablet (10 mg total) by mouth daily.    Dispense:  90 tablet    Refill:  3    NEW DOSE, D/C PREVIOUS RX    Patient Instructions  Medication Instructions:  INCREASE YOUR AMLODIPINE  TO 10 MG DAILY   *If you need a refill on your cardiac medications before your next appointment, please call your pharmacy*  Lab Work: NONE  Testing/Procedures: CALCIUM SCORE   Follow-Up: At [facility], you and your health needs are our priority.  As part of our continuing mission to provide you with exceptional heart care, our providers are all part of one team.  This team includes your primary Cardiologist (physician) and Advanced Practice Providers or APPs (Physician Assistants and Nurse Practitioners) who all work together to provide you with the care you need, when you need it.  Your next appointment:   6 month(s)  Provider:   Lonni Nanas, MD OR NP/PA    We recommend signing up for the patient portal called MyChart.  Sign up information is provided on this After Visit Summary.  MyChart is used to connect with patients for Virtual Visits (Telemedicine).  Patients are able to view lab/test results, encounter notes, upcoming appointments, etc.  Non-urgent messages can be sent to your provider as well.   To learn more about what you can do with MyChart, go to [email or web address].   Other Instructions MONITOR YOUR BLOOD PRESSURE DAILY FOR 2 WEEKS. CALL OR [facility] THE UPDATED READINGS       Signed, Lonni LITTIE Nanas, MD  01/15/2024  1:16 PM    [facility]

## 2024-01-15 ENCOUNTER — Ambulatory Visit (HOSPITAL_BASED_OUTPATIENT_CLINIC_OR_DEPARTMENT_OTHER): Admitting: Cardiology

## 2024-01-15 ENCOUNTER — Encounter (HOSPITAL_BASED_OUTPATIENT_CLINIC_OR_DEPARTMENT_OTHER): Payer: Self-pay | Admitting: Cardiology

## 2024-01-15 VITALS — BP 140/76 | HR 70 | Ht 67.0 in | Wt 168.1 lb

## 2024-01-15 DIAGNOSIS — R600 Localized edema: Secondary | ICD-10-CM | POA: Diagnosis not present

## 2024-01-15 DIAGNOSIS — Z01818 Encounter for other preprocedural examination: Secondary | ICD-10-CM | POA: Diagnosis not present

## 2024-01-15 DIAGNOSIS — I10 Essential (primary) hypertension: Secondary | ICD-10-CM | POA: Diagnosis not present

## 2024-01-15 DIAGNOSIS — E785 Hyperlipidemia, unspecified: Secondary | ICD-10-CM | POA: Diagnosis not present

## 2024-01-15 MED ORDER — AMLODIPINE BESYLATE 10 MG PO TABS: 10.0000 mg | ORAL_TABLET | Freq: Every day | ORAL | 3 refills | Status: AC

## 2024-01-15 NOTE — Patient Instructions (Signed)
 Medication Instructions:  INCREASE YOUR AMLODIPINE  TO 10 MG DAILY   *If you need a refill on your cardiac medications before your next appointment, please call your pharmacy*  Lab Work: NONE  Testing/Procedures: CALCIUM SCORE   Follow-Up: At [facility], you and your health needs are our priority.  As part of our continuing mission to provide you with exceptional heart care, our providers are all part of one team.  This team includes your primary Cardiologist (physician) and Advanced Practice Providers or APPs (Physician Assistants and Nurse Practitioners) who all work together to provide you with the care you need, when you need it.  Your next appointment:   6 month(s)  Provider:   Lonni Nanas, MD OR NP/PA    We recommend signing up for the patient portal called MyChart.  Sign up information is provided on this After Visit Summary.  MyChart is used to connect with patients for Virtual Visits (Telemedicine).  Patients are able to view lab/test results, encounter notes, upcoming appointments, etc.  Non-urgent messages can be sent to your provider as well.   To learn more about what you can do with MyChart, go to [email or web address].   Other Instructions MONITOR YOUR BLOOD PRESSURE DAILY FOR 2 WEEKS. CALL OR MYCHART THE UPDATED READINGS

## 2024-01-29 DIAGNOSIS — N202 Calculus of kidney with calculus of ureter: Secondary | ICD-10-CM | POA: Diagnosis not present

## 2024-01-29 DIAGNOSIS — N201 Calculus of ureter: Secondary | ICD-10-CM | POA: Diagnosis not present

## 2024-01-29 DIAGNOSIS — N400 Enlarged prostate without lower urinary tract symptoms: Secondary | ICD-10-CM | POA: Diagnosis not present

## 2024-01-29 NOTE — Progress Notes (Signed)
 COVID Vaccine received:  []  No []  Yes Date of any COVID positive Test in last 90 days:  PCP - Greig Cluster NP Cardiologist -   Chest x-ray -  EKG -   Stress Test -  ECHO -  Cardiac Cath -   Bowel Prep - []  No  []   Yes ______  Pacemaker / ICD device []  No []  Yes   Spinal Cord Stimulator:[]  No []  Yes       History of Sleep Apnea? []  No []  Yes   CPAP used?- []  No []  Yes    Does the patient monitor blood sugar?          []  No []  Yes  []  N/A  Patient has: []  NO Hx DM   []  Pre-DM                 []  DM1  []   DM2 Does patient have a [employer] or Dexacom? []  No []  Yes   Fasting Blood Sugar Ranges-  Checks Blood Sugar _____ times a day  GLP1 agonist / usual dose -  GLP1 instructions:  SGLT-2 inhibitors / usual dose -  SGLT-2 instructions:   Blood Thinner / Instructions: Aspirin Instructions:  Comments:   Activity level: Patient is able / unable to climb a flight of stairs without difficulty; []  No CP  []  No SOB, but would have ___   Patient can / can not perform ADLs without assistance.   Anesthesia review:   Patient denies shortness of breath, fever, cough and chest pain at PAT appointment.  Patient verbalized understanding and agreement to the Pre-Surgical Instructions that were given to them at this PAT appointment. Patient was also educated of the need to review these PAT instructions again prior to his/her surgery.I reviewed the appropriate phone numbers to call if they have any and questions or concerns.

## 2024-01-29 NOTE — Patient Instructions (Addendum)
 SURGICAL WAITING ROOM VISITATION  Patients having surgery or a procedure may have no more than 2 support people in the waiting area - these visitors may rotate.    Children under the age of 87 must have an adult with them who is not the patient.  Visitors with respiratory illnesses are discouraged from visiting and should remain at home.  If the patient needs to stay at the hospital during part of their recovery, the visitor guidelines for inpatient rooms apply. Pre-op nurse will coordinate an appropriate time for 1 support person to accompany patient in pre-op.  This support person may not rotate.    Please refer to the [facility] website for the visitor guidelines for Inpatients (after your surgery is over and you are in a regular room).       Your procedure is scheduled on: 02/12/24   Report to [facility] Main Entrance    Report to admitting at AM   Call this number if you have problems the morning of surgery [phone]   Do not eat food or drink liquids :After Midnight. But may have sips of water with meds.      Oral Hygiene is also important to reduce your risk of infection.                                    Remember - BRUSH YOUR TEETH THE MORNING OF SURGERY WITH YOUR REGULAR TOOTHPASTE  DENTURES WILL BE REMOVED PRIOR TO SURGERY PLEASE DO NOT APPLY Poly grip OR ADHESIVES!!!   Stop all vitamins and herbal supplements 7 days before surgery.   Take these medicines the morning of surgery with A SIP OF WATER: Amlodipine , loratadine(claritin), Tamsulosin  Bring CPAP mask and tubing day of surgery.                              You may not have any metal on your body including hair pins, jewelry, and body piercing             Do not wear make-up, lotions, powders, perfumes/cologne, or deodorant              Men may shave face and neck.   Do not bring valuables to the hospital. [place] IS NOT             RESPONSIBLE   FOR VALUABLES.   Contacts, glasses,  dentures or bridgework may not be worn into surgery.  DO NOT BRING YOUR HOME MEDICATIONS TO THE HOSPITAL. PHARMACY WILL DISPENSE MEDICATIONS LISTED ON YOUR MEDICATION LIST TO YOU DURING YOUR ADMISSION IN THE HOSPITAL!    Patients discharged on the day of surgery will not be allowed to drive home.  Someone NEEDS to stay with you for the first 24 hours after anesthesia.   Special Instructions: Bring a copy of your healthcare power of attorney and living will documents the day of surgery if you haven't scanned them before.              Please read over the following fact sheets you were given: IF YOU HAVE QUESTIONS ABOUT YOUR PRE-OP INSTRUCTIONS PLEASE CALL [phone] [place]   If you received a COVID test during your pre-op visit  it is requested that you wear a mask when out in public, stay away from anyone that may not be feeling well and notify  your surgeon if you develop symptoms. If you test positive for Covid or have been in contact with anyone that has tested positive in the last 10 days please notify you surgeon.    [place] - Preparing for Surgery Before surgery, you can play an important role.  Because skin is not sterile, your skin needs to be as free of germs as possible.  You can reduce the number of germs on your skin by washing with CHG (chlorahexidine gluconate) soap before surgery.  CHG is an antiseptic cleaner which kills germs and bonds with the skin to continue killing germs even after washing. Please DO NOT use if you have an allergy to CHG or antibacterial soaps.  If your skin becomes reddened/irritated stop using the CHG and inform your nurse when you arrive at Short Stay. Do not shave (including legs and underarms) for at least 48 hours prior to the first CHG shower.  You may shave your face/neck.  Please follow these instructions carefully:  1.  Shower with CHG Soap the night before surgery and the  morning of surgery.  2.  If you choose to wash your hair, wash your hair  first as usual with your normal  shampoo.  3.  After you shampoo, rinse your hair and body thoroughly to remove the shampoo.                             4.  Use CHG as you would any other liquid soap.  You can apply chg directly to the skin and wash.  Gently with a scrungie or clean washcloth.  5.  Apply the CHG Soap to your body ONLY FROM THE NECK DOWN.   Do   not use on face/ open                           Wound or open sores. Avoid contact with eyes, ears mouth and   genitals (private parts).                       Wash face,  Genitals (private parts) with your normal soap.             6.  Wash thoroughly, paying special attention to the area where your    surgery  will be performed.  7.  Thoroughly rinse your body with warm water from the neck down.  8.  DO NOT shower/wash with your normal soap after using and rinsing off the CHG Soap.                9.  Pat yourself dry with a clean towel.            10.  Wear clean pajamas.            11.  Place clean sheets on your bed the night of your first shower and do not  sleep with pets. Day of Surgery : Do not apply any lotions/deodorants the morning of surgery.  Please wear clean clothes to the hospital/surgery center.  FAILURE TO FOLLOW THESE INSTRUCTIONS MAY RESULT IN THE CANCELLATION OF YOUR SURGERY  PATIENT SIGNATURE_________________________________  NURSE SIGNATURE__________________________________  ________________________________________________________________________

## 2024-01-30 ENCOUNTER — Encounter (HOSPITAL_COMMUNITY): Payer: Self-pay

## 2024-01-30 ENCOUNTER — Encounter (HOSPITAL_COMMUNITY)
Admission: RE | Admit: 2024-01-30 | Discharge: 2024-01-30 | Disposition: A | Discharge status: Home / Self Care | Source: Ambulatory Visit | Attending: Anesthesiology | Admitting: Anesthesiology

## 2024-01-30 DIAGNOSIS — I10 Essential (primary) hypertension: Secondary | ICD-10-CM

## 2024-01-31 ENCOUNTER — Ambulatory Visit (HOSPITAL_BASED_OUTPATIENT_CLINIC_OR_DEPARTMENT_OTHER): Attending: Cardiology

## 2024-02-08 ENCOUNTER — Encounter (HOSPITAL_BASED_OUTPATIENT_CLINIC_OR_DEPARTMENT_OTHER): Payer: Self-pay | Admitting: Cardiology

## 2024-02-12 ENCOUNTER — Encounter (HOSPITAL_COMMUNITY): Admission: RE | Payer: Self-pay | Source: Home / Self Care

## 2024-02-12 ENCOUNTER — Ambulatory Visit (HOSPITAL_COMMUNITY): Admission: RE | Admit: 2024-02-12 | Source: Home / Self Care | Admitting: Urology

## 2024-02-12 SURGERY — CYSTOSCOPY/URETEROSCOPY/HOLMIUM LASER/STENT PLACEMENT
Anesthesia: General | Laterality: Right

## 2024-02-19 ENCOUNTER — Ambulatory Visit (HOSPITAL_BASED_OUTPATIENT_CLINIC_OR_DEPARTMENT_OTHER)
Admission: RE | Admit: 2024-02-19 | Discharge: 2024-02-19 | Disposition: A | Discharge status: Home / Self Care | Payer: Self-pay | Source: Ambulatory Visit | Attending: Cardiology | Admitting: Cardiology

## 2024-02-19 DIAGNOSIS — I10 Essential (primary) hypertension: Secondary | ICD-10-CM | POA: Insufficient documentation

## 2024-02-21 ENCOUNTER — Ambulatory Visit: Payer: Self-pay | Admitting: Cardiology

## 2024-02-21 DIAGNOSIS — E785 Hyperlipidemia, unspecified: Secondary | ICD-10-CM

## 2024-02-26 MED ORDER — ROSUVASTATIN CALCIUM 10 MG PO TABS: 10.0000 mg | ORAL_TABLET | Freq: Every day | ORAL | 3 refills | Status: AC

## 2024-02-28 ENCOUNTER — Encounter: Payer: Self-pay | Admitting: Orthopedic Surgery

## 2024-02-28 ENCOUNTER — Ambulatory Visit (INDEPENDENT_AMBULATORY_CARE_PROVIDER_SITE_OTHER): Admitting: Orthopedic Surgery

## 2024-02-28 VITALS — BP 136/72 | HR 62 | Temp 97.7°F | Ht 67.0 in | Wt 169.2 lb

## 2024-02-28 DIAGNOSIS — D649 Anemia, unspecified: Secondary | ICD-10-CM

## 2024-02-28 DIAGNOSIS — I10 Essential (primary) hypertension: Secondary | ICD-10-CM

## 2024-02-28 DIAGNOSIS — Z87442 Personal history of urinary calculi: Secondary | ICD-10-CM

## 2024-02-28 DIAGNOSIS — G4733 Obstructive sleep apnea (adult) (pediatric): Secondary | ICD-10-CM

## 2024-02-28 DIAGNOSIS — E7800 Pure hypercholesterolemia, unspecified: Secondary | ICD-10-CM | POA: Diagnosis not present

## 2024-02-28 DIAGNOSIS — Z23 Encounter for immunization: Secondary | ICD-10-CM | POA: Diagnosis not present

## 2024-02-28 NOTE — Patient Instructions (Signed)
 Schedule fasting lab visit in December to recheck cholesterol> no food 12 hours before, water and black coffee ok  Water with lemon once daily to prevent kidney stones  Look up kidney stone foods> spinach, ice tea will contribute to stones

## 2024-02-28 NOTE — Progress Notes (Signed)
 Careteam: Patient Care Team: Gil Greig BRAVO, NP as PCP - General (Adult Health Nurse Practitioner) Watt Rush, MD as Attending Physician (Urology) Court Pulling, MD as Referring Physician (Dermatology)  Seen by: Greig Gil, AGNP-C  PLACE OF SERVICE:  [facility]  Advanced Directive information    No Known Allergies  Chief Complaint  Patient presents with   Follow-up    6 month follow up     HPI: Patient is a 73 y.o. male seen today for medical management of chronic conditions.   Discussed the use of AI scribe software for clinical note transcription with the patient, who gave verbal consent to proceed.  History of Present Illness   Ryan [name] is a 73 year old male who presents for follow-up after lithotripsy for kidney stones and subsequent complications.  He underwent lithotripsy for kidney stones, which resulted in a hematoma on the back of his kidney. This complication led to trouble breathing and swallowing, prompting a consultation with a gastroenterologist who recommended an endoscopy. However, his symptoms improved within a week, and the endoscopy was not performed. He completed a 14-day course of Carafate  as well.  A follow-up CT scan showed the kidney stone had moved from the kidney to the top of the bladder, but a repeat scan confirmed the stone had passed naturally. He has no current urinary symptoms, flank pain, or hematuria. His urine is now 'practically clear,' which he attributes to increased water intake.  He has made dietary changes to prevent future kidney stones, including stopping turmeric and reducing salt intake.  He was recently started on rosuvastatin 10 mg daily by cardiology after a CT cardiac scoring indicated the need for cholesterol management. He has been on the medication for three days without noticing any side effects.  He continues to use a CPAP machine for sleep apnea and reports no issues.   He monitors his blood pressure daily  and notes no significant changes since starting amlodipine  10 mg, although he has experienced some ankle swelling, which he attributes to the medication.   Review of Systems:  Review of Systems  Constitutional: Negative.   HENT: Negative.    Respiratory: Negative.    Cardiovascular: Negative.   Gastrointestinal: Negative.   Genitourinary: Negative.   Musculoskeletal: Negative.   Skin: Negative.   Neurological: Negative.   Psychiatric/Behavioral: Negative.      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    Family history of melanoma    Family history of non-Hodgkin's lymphoma    Family hx of melanoma    History of bone density study 2017   Hx of adenomatous colonic polyps    and hyperplastic   Hypertension    Kidney stones    Osteopenia    Peptic ulcer disease    Sleep apnea    CPAP   Vitamin D  deficiency    Past Surgical History:  Procedure Laterality Date   COLONOSCOPY  2009,2015   EXTRACORPOREAL SHOCK WAVE LITHOTRIPSY     EXTRACORPOREAL SHOCK WAVE LITHOTRIPSY Right 11/16/2023   Procedure: LITHOTRIPSY, ESWL;  Surgeon: Elisabeth Valli BIRCH, MD;  Location: [facility];  Service: Urology;  Laterality: Right;   MOUTH SURGERY  05/29/2012   TONSILLECTOMY AND ADENOIDECTOMY  05/29/1970   VASECTOMY  05/30/1979   Social History:   reports that he quit  smoking about 53 years ago. His smoking use included cigarettes. He has never used smokeless tobacco. He reports current alcohol use. He reports that he does not use drugs.  Family History  Problem Relation Age of Onset   Broken bones Father    Melanoma Brother 37   Non-Hodgkin's lymphoma Brother 76   CAD Brother    Parkinson's disease Maternal Grandmother    Breast cancer Daughter 50       CHEK2+   Heart attack Paternal Uncle 40   Cancer Cousin 40       type unk   Liver  disease Neg Hx    Esophageal cancer Neg Hx    Colon cancer Neg Hx     Medications: Patient's Medications  New Prescriptions   No medications on file  Previous Medications   AMLODIPINE  (NORVASC ) 10 MG TABLET    Take 1 tablet (10 mg total) by mouth daily.   B COMPLEX-C (B COMPLEX-VITAMIN C PO)    Take 1 tablet by mouth daily.   CALCIUM CARB-CHOLECALCIFEROL (CALCIUM 600+D3) 600-20 MG-MCG TABS    Take 1 tablet by mouth daily.   COENZYME Q10 (CO Q-10) 100 MG CAPS    Take 100 mg by mouth daily.   DICLOFENAC SODIUM (VOLTAREN ARTHRITIS PAIN) 1 % GEL    Apply 2 g topically daily as needed (pain).   DOCUSATE SODIUM (COLACE) 100 MG CAPSULE    Take 100 mg by mouth daily as needed for mild constipation.   GINKGO BILOBA 120 MG TABS    Take 120 mg by mouth daily.   GLUCOSAMINE-CHONDROITIN PO    Take 1,500 mg by mouth daily. 500 mg each   LORATADINE (CLARITIN) 10 MG TABLET    Take 10 mg by mouth daily as needed (allergies).   MULTIPLE VITAMIN (MULTIVITAMIN) TABLET    Take 1 tablet by mouth daily. Senior 50+   OMEGA-3 FATTY ACIDS (OMEGA 3 PO)    Take 1,000 mg by mouth daily.   ONDANSETRON (ZOFRAN-ODT) 4 MG DISINTEGRATING TABLET    Take 4 mg by mouth every 6 (six) hours as needed.   OVER THE COUNTER MEDICATION    Take 1 capsule by mouth 2 (two) times daily. Super beta prostate advance   RED YEAST RICE EXTRACT (RED YEAST RICE PO)    Take 1,200 mg by mouth daily at 12 noon. 600 mg each   ROSUVASTATIN (CRESTOR) 10 MG TABLET    Take 1 tablet (10 mg total) by mouth daily.   SODIUM HYALURONATE, ORAL, (HYALURONIC ACID) 100 MG CAPS    Take 100 mg by mouth in the morning and at bedtime.   TAMSULOSIN (FLOMAX) 0.4 MG CAPS CAPSULE    Take 0.4 mg by mouth daily.   TURMERIC PO    Take 1,600 mg by mouth daily. 800 mg each   VITAMIN D , ERGOCALCIFEROL , (DRISDOL ) 1.25 MG (50000 UNIT) CAPS CAPSULE    TAKE 1 CAPSULE EVERY 14 DAYS.  Modified Medications   No medications on file  Discontinued Medications   No medications on  file    Physical Exam:  Vitals:   02/28/24 0924 02/28/24 0933  BP: (!) 148/80 (!) 138/90  Pulse: 62   Temp: 97.7 F (36.5 C)   SpO2: 99%   Weight: 169 lb 3.2 oz (76.7 kg)   Height: 5' 7 (1.702 m)    Body mass index is 26.5 kg/m. Wt Readings from Last 3 Encounters:  02/28/24 169 lb 3.2 oz (76.7 kg)  01/15/24 168  lb 2 oz (76.3 kg)  12/06/23 177 lb (80.3 kg)    Physical Exam Vitals reviewed.  Constitutional:      General: He is not in acute distress. HENT:     Head: Normocephalic.  Eyes:     General:        Right eye: No discharge.        Left eye: No discharge.  Neck:     Thyroid: No thyroid mass or thyromegaly.     Vascular: No carotid bruit.  Cardiovascular:     Rate and Rhythm: Normal rate and regular rhythm.     Pulses: Normal pulses.     Heart sounds: Normal heart sounds.  Pulmonary:     Effort: Pulmonary effort is normal.     Breath sounds: Normal breath sounds.  Abdominal:     General: Bowel sounds are normal.     Palpations: Abdomen is soft.  Musculoskeletal:     Cervical back: Neck supple.     Right lower leg: Edema present.     Left lower leg: Edema present.     Comments: Non pitting  Skin:    General: Skin is warm.     Capillary Refill: Capillary refill takes less than 2 seconds.  Neurological:     General: No focal deficit present.     Mental Status: He is alert and oriented to person, place, and time.  Psychiatric:        Mood and Affect: Mood normal.     Labs reviewed: Basic Metabolic Panel: Recent Labs    08/23/23 0905 12/05/23 0000  NA 140 141  K 4.4 4.4  CL 105 102  CO2 28 28*  GLUCOSE 88  --   BUN 23 22*  CREATININE 0.92 1.0  CALCIUM 10.3 9.5   Liver Function Tests: Recent Labs    08/23/23 0905  AST 23  ALT 29  BILITOT 1.4*  PROT 6.6   No results for input(s): LIPASE, AMYLASE in the last 8760 hours. No results for input(s): AMMONIA in the last 8760 hours. CBC: Recent Labs    11/26/23 0000 11/29/23 0000  12/05/23 0000  WBC 7.3  --   --   HGB 9.3* 9.7* 10.4*  HCT 28* 29* 32*  PLT 479*  --   --    Lipid Panel: Recent Labs    08/23/23 0905  CHOL 216*  HDL 79  LDLCALC 116*  TRIG 107  CHOLHDL 2.7   TSH: No results for input(s): TSH in the last 8760 hours. A1C: No results found for: HGBA1C   Assessment/Plan 1. Primary hypertension (Primary) - controlled with amlodipine  - BUN/creat 22/1.0 02/28/2024 - Complete Metabolic Panel with eGFR  2. Pure hypercholesterolemia - total 216. LDL 116 07/2023 - CT cardiac scoring noted aortic atherosclerosis> rosuvastatin started - repeat lipid panel in 3 months - Lipid Panel; Future  3. Obstructive sleep apnea - cont CPAP qhs  4. Low hemoglobin - h/o right kidney hematoma s/p lithotripsy 06/20 - hgb 9.7 12/05/2023 - CBC with Differential/Platelet  5. History of kidney stones - followed by urology - s/p lithotripsy 06/20 - asymptomatic at this time  Total time: 33 minutes. Greater than 50% of total time spent doing patient education regarding health maintenance, HTN, HLD, kidney stones and hematoma including symptom/medication management.     Next appt: Caitlin Ainley FORBES Cluster, NP  Ritchie Klee Cluster BODILY  [facility] [phone]

## 2024-02-29 LAB — CBC WITH DIFFERENTIAL/PLATELET
Absolute Lymphocytes: 1043 {cells}/uL (ref 850–3900)
Absolute Monocytes: 299 {cells}/uL (ref 200–950)
Basophils Absolute: 31 {cells}/uL (ref 0–200)
Basophils Relative: 0.7 %
Eosinophils Absolute: 88 {cells}/uL (ref 15–500)
Eosinophils Relative: 2 %
HCT: 49.3 % (ref 38.5–50.0)
Hemoglobin: 16 g/dL (ref 13.2–17.1)
MCH: 29.2 pg (ref 27.0–33.0)
MCHC: 32.5 g/dL (ref 32.0–36.0)
MCV: 90 fL (ref 80.0–100.0)
MPV: 10 fL (ref 7.5–12.5)
Monocytes Relative: 6.8 %
Neutro Abs: 2939 {cells}/uL (ref 1500–7800)
Neutrophils Relative %: 66.8 %
Platelets: 235 Thousand/uL (ref 140–400)
RBC: 5.48 Million/uL (ref 4.20–5.80)
RDW: 13.8 % (ref 11.0–15.0)
Total Lymphocyte: 23.7 %
WBC: 4.4 Thousand/uL (ref 3.8–10.8)

## 2024-02-29 LAB — COMPLETE METABOLIC PANEL WITHOUT GFR
AG Ratio: 2.4 (calc) (ref 1.0–2.5)
ALT: 24 U/L (ref 9–46)
AST: 21 U/L (ref 10–35)
Albumin: 4.7 g/dL (ref 3.6–5.1)
Alkaline phosphatase (APISO): 117 U/L (ref 35–144)
BUN: 25 mg/dL (ref 7–25)
CO2: 31 mmol/L (ref 20–32)
Calcium: 10.8 mg/dL — ABNORMAL HIGH (ref 8.6–10.3)
Chloride: 103 mmol/L (ref 98–110)
Creat: 1.12 mg/dL (ref 0.70–1.28)
Globulin: 2 g/dL (ref 1.9–3.7)
Glucose, Bld: 80 mg/dL (ref 65–139)
Potassium: 4.4 mmol/L (ref 3.5–5.3)
Sodium: 139 mmol/L (ref 135–146)
Total Bilirubin: 0.9 mg/dL (ref 0.2–1.2)
Total Protein: 6.7 g/dL (ref 6.1–8.1)

## 2024-03-03 ENCOUNTER — Ambulatory Visit: Payer: Self-pay | Admitting: Orthopedic Surgery

## 2024-03-03 DIAGNOSIS — M85859 Other specified disorders of bone density and structure, unspecified thigh: Secondary | ICD-10-CM

## 2024-03-03 DIAGNOSIS — E559 Vitamin D deficiency, unspecified: Secondary | ICD-10-CM

## 2024-03-03 NOTE — Addendum Note (Signed)
 Addended by: SUELLEN DEVIN BROCKS on: 03/03/2024 12:09 PM   Modules accepted: Orders

## 2024-03-03 NOTE — Telephone Encounter (Signed)
 You can stop Calcium/Vitamin D  3 supplement. Continue the Vitamin D  you use every 14 days.

## 2024-03-06 DIAGNOSIS — D1801 Hemangioma of skin and subcutaneous tissue: Secondary | ICD-10-CM | POA: Diagnosis not present

## 2024-03-06 DIAGNOSIS — B078 Other viral warts: Secondary | ICD-10-CM | POA: Diagnosis not present

## 2024-03-06 DIAGNOSIS — D2371 Other benign neoplasm of skin of right lower limb, including hip: Secondary | ICD-10-CM | POA: Diagnosis not present

## 2024-03-06 DIAGNOSIS — D225 Melanocytic nevi of trunk: Secondary | ICD-10-CM | POA: Diagnosis not present

## 2024-03-06 DIAGNOSIS — L821 Other seborrheic keratosis: Secondary | ICD-10-CM | POA: Diagnosis not present

## 2024-03-06 DIAGNOSIS — C44311 Basal cell carcinoma of skin of nose: Secondary | ICD-10-CM | POA: Diagnosis not present

## 2024-03-12 ENCOUNTER — Telehealth: Payer: Self-pay | Admitting: Orthopedic Surgery

## 2024-03-12 NOTE — Telephone Encounter (Signed)
 Delivery Service dropped off a Dermatopathology report. Given to CI.

## 2024-03-17 ENCOUNTER — Other Ambulatory Visit

## 2024-03-17 DIAGNOSIS — E559 Vitamin D deficiency, unspecified: Secondary | ICD-10-CM | POA: Diagnosis not present

## 2024-03-17 DIAGNOSIS — M85859 Other specified disorders of bone density and structure, unspecified thigh: Secondary | ICD-10-CM | POA: Diagnosis not present

## 2024-03-17 DIAGNOSIS — E7800 Pure hypercholesterolemia, unspecified: Secondary | ICD-10-CM

## 2024-03-17 LAB — LIPID PANEL
Cholesterol: 138 mg/dL (ref <200)
HDL: 69 mg/dL (ref >=40)
LDL Cholesterol (Calc): 50 mg/dL
Non-HDL Cholesterol (Calc): 69 mg/dL (ref <130)
Total CHOL/HDL Ratio: 2 (calc) (ref <5.0)
Triglycerides: 109 mg/dL (ref <150)

## 2024-03-17 LAB — PTH, INTACT AND CALCIUM
Calcium: 10.3 mg/dL (ref 8.6–10.3)
PTH: 71 pg/mL (ref 16–77)

## 2024-03-17 LAB — VITAMIN D 25 HYDROXY (VIT D DEFICIENCY, FRACTURES): Vit D, 25-Hydroxy: 65 ng/mL (ref 30–100)

## 2024-04-15 DIAGNOSIS — Z85828 Personal history of other malignant neoplasm of skin: Secondary | ICD-10-CM | POA: Diagnosis not present

## 2024-04-15 DIAGNOSIS — C44311 Basal cell carcinoma of skin of nose: Secondary | ICD-10-CM | POA: Diagnosis not present

## 2024-04-18 DIAGNOSIS — H2513 Age-related nuclear cataract, bilateral: Secondary | ICD-10-CM | POA: Diagnosis not present

## 2024-04-18 DIAGNOSIS — H52203 Unspecified astigmatism, bilateral: Secondary | ICD-10-CM | POA: Diagnosis not present

## 2024-04-18 DIAGNOSIS — H524 Presbyopia: Secondary | ICD-10-CM | POA: Diagnosis not present

## 2024-04-18 DIAGNOSIS — H35371 Puckering of macula, right eye: Secondary | ICD-10-CM | POA: Diagnosis not present

## 2024-04-18 DIAGNOSIS — H5203 Hypermetropia, bilateral: Secondary | ICD-10-CM | POA: Diagnosis not present

## 2024-05-01 ENCOUNTER — Other Ambulatory Visit: Payer: Self-pay

## 2024-05-01 ENCOUNTER — Telehealth: Payer: Self-pay | Admitting: Cardiology

## 2024-05-01 DIAGNOSIS — E785 Hyperlipidemia, unspecified: Secondary | ICD-10-CM

## 2024-05-01 NOTE — Telephone Encounter (Signed)
 Pt called in stating he was supposed to have labs drawn however there are not orders in. Please advise.

## 2024-05-01 NOTE — Telephone Encounter (Signed)
 Lipid panel recommended by Dr. Kate ordered and released to [facility]. Notified patient and informed him he can return to lab to have drawn. Patient verbalized understanding and expressed appreciation for call.

## 2024-05-08 ENCOUNTER — Other Ambulatory Visit

## 2024-05-08 LAB — LIPID PANEL
Chol/HDL Ratio: 2.1 ratio (ref 0.0–5.0)
Cholesterol, Total: 163 mg/dL (ref 100–199)
HDL: 78 mg/dL (ref >39)
LDL Chol Calc (NIH): 70 mg/dL (ref 0–99)
Triglycerides: 80 mg/dL (ref 0–149)
VLDL Cholesterol Cal: 15 mg/dL (ref 5–40)

## 2024-05-11 ENCOUNTER — Ambulatory Visit: Payer: Self-pay | Admitting: Cardiology

## 2024-07-04 ENCOUNTER — Ambulatory Visit (HOSPITAL_BASED_OUTPATIENT_CLINIC_OR_DEPARTMENT_OTHER): Admitting: Family

## 2024-07-11 ENCOUNTER — Ambulatory Visit (HOSPITAL_BASED_OUTPATIENT_CLINIC_OR_DEPARTMENT_OTHER): Admitting: Family

## 2024-08-28 ENCOUNTER — Ambulatory Visit: Payer: Self-pay | Admitting: Orthopedic Surgery
